# Patient Record
Sex: Female | Born: 1995 | Race: White | Hispanic: No | Marital: Single | Smoking: Never smoker
Health system: Southern US, Community
[De-identification: ages and names within clinical notes are randomized; demographics above are authoritative.]

## PROBLEM LIST (undated history)

## (undated) DIAGNOSIS — E109 Type 1 diabetes mellitus without complications: Secondary | ICD-10-CM

## (undated) DIAGNOSIS — E119 Type 2 diabetes mellitus without complications: Secondary | ICD-10-CM

## (undated) HISTORY — PX: TONSILLECTOMY: SUR1361

---
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
## 2019-09-03 ENCOUNTER — Ambulatory Visit
Admission: EM | Admit: 2019-09-03 | Discharge: 2019-09-03 | Disposition: A | Discharge status: Home / Self Care | Payer: 59 | Attending: Emergency Medicine | Admitting: Emergency Medicine

## 2019-09-03 ENCOUNTER — Encounter: Payer: Self-pay | Admitting: Emergency Medicine

## 2019-09-03 ENCOUNTER — Other Ambulatory Visit: Payer: Self-pay

## 2019-09-03 ENCOUNTER — Ambulatory Visit (INDEPENDENT_AMBULATORY_CARE_PROVIDER_SITE_OTHER): Payer: 59

## 2019-09-03 DIAGNOSIS — Z7189 Other specified counseling: Secondary | ICD-10-CM

## 2019-09-03 DIAGNOSIS — R05 Cough: Secondary | ICD-10-CM | POA: Diagnosis not present

## 2019-09-03 DIAGNOSIS — R079 Chest pain, unspecified: Secondary | ICD-10-CM

## 2019-09-03 DIAGNOSIS — R0981 Nasal congestion: Secondary | ICD-10-CM | POA: Diagnosis not present

## 2019-09-03 DIAGNOSIS — B9789 Other viral agents as the cause of diseases classified elsewhere: Secondary | ICD-10-CM | POA: Diagnosis not present

## 2019-09-03 DIAGNOSIS — R042 Hemoptysis: Secondary | ICD-10-CM

## 2019-09-03 DIAGNOSIS — J029 Acute pharyngitis, unspecified: Secondary | ICD-10-CM

## 2019-09-03 DIAGNOSIS — J069 Acute upper respiratory infection, unspecified: Secondary | ICD-10-CM

## 2019-09-03 HISTORY — DX: Type 2 diabetes mellitus without complications: E11.9

## 2019-09-03 HISTORY — DX: Type 1 diabetes mellitus without complications: E10.9

## 2019-09-03 LAB — RAPID STREP SCREEN (MED CTR MEBANE ONLY): Streptococcus, Group A Screen (Direct): NEGATIVE

## 2019-09-03 MED ORDER — BENZONATATE 100 MG PO CAPS: 100.0000 mg | ORAL_CAPSULE | Freq: Three times a day (TID) | ORAL | 0 refills | Status: DC | PRN

## 2019-09-03 NOTE — Discharge Instructions (Signed)
Take medication as prescribed.  Over-the-counter Mucinex and antihistamines.  Rest. Drink plenty of fluids.  Monitor.  Follow up with your primary care physician this week as needed. Return to Urgent care for new or worsening concerns.

## 2019-09-03 NOTE — ED Triage Notes (Signed)
Patient c/o nasal drainage and congestion that started 3 days ago. She started with a mild cough last night and the cough has increased this morning. She is concerned because she coughed up some blood tinged sputum this morning. Patient was tested for COVID 2 weeks ago so symptoms have developed since being tested.  She has been taking Mucinex D, Advil and has increased her water intake.

## 2019-09-03 NOTE — ED Provider Notes (Signed)
[facility] ____________________________________________  Time seen: Approximately 1:42 PM  I have reviewed the triage vital signs and the nursing notes.   HISTORY  Chief Complaint Nasal Congestion and Cough   HPI Natalie Marshall is a 23 y.o. female type I diabetic presenting for evaluation of 3 to 4 days of runny nose, nasal congestion and sore throat with onset of cough last night.  States cough has been somewhat productive of whitish-yellowish mucus.  States single episode of hemoptysis this morning described as pink blood mixed in with mucus after coughing.  Denies any other hemoptysis or abnormal bleeding.  Denies chest pain or shortness of breath.  Overall continues eat and drink well.  Denies known sick contacts.  No home sick contacts.  Denies fevers.  Reports blood sugars overall are doing well this morning was 170, she is on a continuous pump and monitor.  Sore throat currently mild.  Did take over-the-counter Mucinex which helped some.  Denies changes to taste or smell.  Denies vomiting, diarrhea, dysuria, abdominal pain.  Denies recent sickness.  But does report she was recently admitted for DKA approximate 3 weeks ago and had negative COVID testing at that time.  PCP: [facility]  No LMP recorded. (Menstrual status: IUD).  Denies pregnancy   Past Medical History:  Diagnosis Date  . Diabetes mellitus type 1, uncomplicated, on long term insulin pump ([place])   . Diabetes mellitus without complication ([place])     There are no active problems to display for this patient.   Past Surgical History:  Procedure Laterality Date  . TONSILLECTOMY       No current facility-administered medications for this encounter.   Current Outpatient Medications:  .  albuterol (VENTOLIN HFA) 108 (90 Base) MCG/ACT inhaler, Inhale into the lungs., Disp: , Rfl:  .  Continuous Blood Gluc Receiver (DEXCOM G6 RECEIVER) DEVI, , Disp: , Rfl:  .  insulin aspart (NOVOLOG FLEXPEN) 100 UNIT/ML  FlexPen, Inject into the skin., Disp: , Rfl:  .  Levonorgestrel (KYLEENA) 19.5 MG IUD, by Intrauterine route., Disp: , Rfl:  .  ondansetron (ZOFRAN-ODT) 8 MG disintegrating tablet, Take by mouth., Disp: , Rfl:  .  benzonatate (TESSALON PERLES) 100 MG capsule, Take 1 capsule (100 mg total) by mouth 3 (three) times daily as needed., Disp: 15 capsule, Rfl: 0  Allergies Patient has no known allergies.  History reviewed. No pertinent family history.  Social History Social History   Tobacco Use  . Smoking status: Never Smoker  . Smokeless tobacco: Never Used  Substance Use Topics  . Alcohol use: Not Currently  . Drug use: Yes    Types: Marijuana    Review of Systems Constitutional: No fever ENT: As above.  Positive nasal congestion. Cardiovascular: Denies chest pain. Respiratory: Denies shortness of breath. Gastrointestinal: No abdominal pain.  No nausea, no vomiting.  No diarrhea.  No constipation. Genitourinary: Negative for dysuria. Musculoskeletal: Negative for back pain. Skin: Negative for rash.   ____________________________________________   PHYSICAL EXAM:  VITAL SIGNS: ED Triage Vitals  Enc Vitals Group     BP 09/03/19 1319 (!) 136/96     Pulse Rate 09/03/19 1319 (!) 118 Recheck 94     Resp 09/03/19 1319 18     Temp 09/03/19 1319 98.4 F (36.9 C)     Temp Source 09/03/19 1319 Oral     SpO2 09/03/19 1319 98 %     Weight 09/03/19 1314 140 lb (63.5 kg)     Height 09/03/19 1314 5'  1" (1.549 m)     Head Circumference --      Peak Flow --      Pain Score 09/03/19 1312 3     Pain Loc --      Pain Edu? --      Excl. in [place]? --     Constitutional: Alert and oriented. Well appearing and in no acute distress. Eyes: Conjunctivae are normal.  Head: Atraumatic. No swelling. No erythema.  Ears: no erythema, normal TMs bilaterally.   Nose:Nasal congestion   Mouth/Throat: Mucous membranes are moist. Mild pharyngeal erythema. No tonsillar swelling or exudate.  Neck: No  stridor.  No cervical spine tenderness to palpation. Hematological/Lymphatic/Immunilogical: No cervical lymphadenopathy. Cardiovascular: Normal rate, regular rhythm. Grossly normal heart sounds.  Good peripheral circulation. Respiratory: Normal respiratory effort.  No retractions. No wheezes, rales or rhonchi. Good air movement.  Musculoskeletal: Ambulatory with steady gait.  Neurologic:  Normal speech and language. No gait instability. Skin:  Skin appears warm, dry and intact. No rash noted. Psychiatric: Mood and affect are normal. Speech and behavior are normal. ___________________________________________   LABS (all labs ordered are listed, but only abnormal results are displayed)  Labs Reviewed  RAPID STREP SCREEN ([facility] ONLY)  NOVEL CORONAVIRUS, NAA (HOSP ORDER, SEND-OUT TO REF LAB; TAT 18-24 HRS)  CULTURE, GROUP A STREP [facility])   ____________________________________________  RADIOLOGY  Dg Chest 2 View  Result Date: 09/03/2019 CLINICAL DATA:  Cough.  Hemoptysis. EXAM: CHEST - 2 VIEW COMPARISON:  None. FINDINGS: Curvature of the thoracic spine, apex to the right, is identified. The heart, hila, mediastinum, lungs, and pleura are otherwise normal. IMPRESSION: No active cardiopulmonary disease. Electronically Signed   By: Gerome Samavid  Williams III M.D   On: 09/03/2019 13:49   ____________________________________________   PROCEDURES Procedures    INITIAL IMPRESSION / ASSESSMENT AND PLAN / ED COURSE  Pertinent labs & imaging results that were available during my care of the patient were reviewed by me and considered in my medical decision making (see chart for details).  Overall well-appearing patient.  No acute distress.  Strep negative, will culture.  COVID-19 tested completed and advice given.  Suspect viral illness.  Chest x-ray negative.  Encourage continue over-the-counter Mucinex, Sudafed as needed and PRN Tessalon Perles.  Previously did take antihistamines regularly,  not currently, encouraged to restart.  Supportive care.  Discussed follow-up and return parameters.Discussed indication, risks and benefits of medications with patient.  Discussed follow up with Primary care physician this week as needed. Discussed follow up and return parameters including no resolution or any worsening concerns. Patient verbalized understanding and agreed to plan.   ____________________________________________   FINAL CLINICAL IMPRESSION(S) / ED DIAGNOSES  Final diagnoses:  Viral URI with cough  Advice Given About Covid-19 Virus Infection  Hemoptysis     ED Discharge Orders         Ordered    benzonatate (TESSALON PERLES) 100 MG capsule  3 times daily PRN     09/03/19 1351           Note: This dictation was prepared with Dragon dictation along with smaller phrase technology. Any transcriptional errors that result from this process are unintentional.         Renford DillsMiller, Lorra Freeman, NP 09/03/19 1415

## 2019-09-04 LAB — NOVEL CORONAVIRUS, NAA (HOSP ORDER, SEND-OUT TO REF LAB; TAT 18-24 HRS): SARS-CoV-2, NAA: NOT DETECTED

## 2019-09-06 LAB — CULTURE, GROUP A STREP (THRC)

## 2019-10-25 ENCOUNTER — Ambulatory Visit
Admission: EM | Admit: 2019-10-25 | Discharge: 2019-10-25 | Disposition: A | Discharge status: Home / Self Care | Payer: 59 | Attending: Family Medicine | Admitting: Family Medicine

## 2019-10-25 ENCOUNTER — Encounter: Payer: Self-pay | Admitting: Emergency Medicine

## 2019-10-25 ENCOUNTER — Other Ambulatory Visit: Payer: Self-pay

## 2019-10-25 DIAGNOSIS — J020 Streptococcal pharyngitis: Secondary | ICD-10-CM

## 2019-10-25 DIAGNOSIS — J029 Acute pharyngitis, unspecified: Secondary | ICD-10-CM | POA: Diagnosis not present

## 2019-10-25 LAB — RAPID STREP SCREEN (MED CTR MEBANE ONLY): Streptococcus, Group A Screen (Direct): POSITIVE — AB

## 2019-10-25 MED ORDER — LIDOCAINE VISCOUS HCL 2 % MT SOLN: OROMUCOSAL | 0 refills | Status: DC

## 2019-10-25 MED ORDER — PENICILLIN V POTASSIUM 500 MG PO TABS: 500.0000 mg | ORAL_TABLET | Freq: Three times a day (TID) | ORAL | 0 refills | Status: DC

## 2019-10-25 NOTE — ED Triage Notes (Signed)
Patient c/o sore throat that started 3 days ago.  Patient denies fevers.  Patient was seen on 9/17 for similar symptoms.  Patient was tested for COVID on 9/17 and was Negative.

## 2019-10-25 NOTE — Discharge Instructions (Signed)
Rest, fluids, tylenol/advil °

## 2019-10-25 NOTE — ED Provider Notes (Signed)
[facility]    CSN: [id number] Arrival date & time: 10/25/19  1035      History   Chief Complaint Chief Complaint  Patient presents with  . Sore Throat    HPI Natalie Marshall is a 23 y.o. female.   23 yo female with a c/o sore throat for the past 3 days. Denies any fevers, chills, shortness of breath, cough.    Sore Throat    Past Medical History:  Diagnosis Date  . Diabetes mellitus type 1, uncomplicated, on long term insulin pump ([place])   . Diabetes mellitus without complication ([place])     There are no active problems to display for this patient.   Past Surgical History:  Procedure Laterality Date  . TONSILLECTOMY      OB History   No obstetric history on file.      Home Medications    Prior to Admission medications   Medication Sig Start Date End Date Taking? Authorizing Provider  albuterol (VENTOLIN HFA) 108 (90 Base) MCG/ACT inhaler Inhale into the lungs. 10/29/17  Yes [provider]  Continuous Blood Gluc Receiver ([place]) [place]  04/09/19  Yes [provider]  insulin aspart (NOVOLOG FLEXPEN) 100 UNIT/ML FlexPen Inject into the skin. 07/03/19 07/02/20 Yes [provider]  Levonorgestrel (KYLEENA) 19.5 MG IUD by Intrauterine route. 02/22/17  Yes [provider]  benzonatate (TESSALON PERLES) 100 MG capsule Take 1 capsule (100 mg total) by mouth 3 (three) times daily as needed. 09/03/19   Marylene Land, NP  lidocaine (XYLOCAINE) 2 % solution 20 ml gargle and spit q 6 hours prn 10/25/19   Norval Gable, MD  ondansetron (ZOFRAN-ODT) 8 MG disintegrating tablet Take by mouth. 05/05/19   [provider]  penicillin v potassium (VEETID) 500 MG tablet Take 1 tablet (500 mg total) by mouth 3 (three) times daily. 10/25/19   Norval Gable, MD    Family History History reviewed. No pertinent family history.  Social History Social History   Tobacco Use  . Smoking status: Never Smoker  . Smokeless  tobacco: Never Used  Substance Use Topics  . Alcohol use: Not Currently  . Drug use: Yes    Types: Marijuana     Allergies   Patient has no known allergies.   Review of Systems Review of Systems   Physical Exam Triage Vital Signs ED Triage Vitals  Enc Vitals Group     BP 10/25/19 1048 (!) 141/89     Pulse Rate 10/25/19 1048 (!) 105     Resp 10/25/19 1048 16     Temp 10/25/19 1048 98.7 F (37.1 C)     Temp Source 10/25/19 1048 Oral     SpO2 10/25/19 1048 100 %     Weight 10/25/19 1044 138 lb (62.6 kg)     Height 10/25/19 1044 5\' 1"  (1.549 m)     Head Circumference --      Peak Flow --      Pain Score 10/25/19 1044 6     Pain Loc --      Pain Edu? --      Excl. in [place]? --    No data found.  Updated Vital Signs BP (!) 141/89 (BP Location: Left Arm)   Pulse (!) 105   Temp 98.7 F (37.1 C) (Oral)   Resp 16   Ht 5\' 1"  (1.549 m)   Wt 62.6 kg   LMP 10/16/2019 (Approximate)   SpO2 100%   BMI 26.07  kg/m   Visual Acuity Right Eye Distance:   Left Eye Distance:   Bilateral Distance:    Right Eye Near:   Left Eye Near:    Bilateral Near:     Physical Exam Vitals signs and nursing note reviewed.  Constitutional:      General: She is not in acute distress.    Appearance: She is not toxic-appearing or diaphoretic.  HENT:     Mouth/Throat:     Pharynx: Oropharyngeal exudate and posterior oropharyngeal erythema present.  Cardiovascular:     Rate and Rhythm: Tachycardia present.  Pulmonary:     Effort: Pulmonary effort is normal. No respiratory distress.  Neurological:     Mental Status: She is alert.      UC Treatments / Results  Labs (all labs ordered are listed, but only abnormal results are displayed) Labs Reviewed  RAPID STREP SCREEN ([facility] ONLY) - Abnormal; Notable for the following components:      Result Value   Streptococcus, Group A Screen (Direct) POSITIVE (*)    All other components within normal limits  NOVEL CORONAVIRUS, NAA  (HOSPITAL ORDER, SEND-OUT TO REF LAB)    EKG   Radiology No results found.  Procedures Procedures (including critical care time)  Medications Ordered in UC Medications - No data to display  Initial Impression / Assessment and Plan / UC Course  I have reviewed the triage vital signs and the nursing notes.  Pertinent labs & imaging results that were available during my care of the patient were reviewed by me and considered in my medical decision making (see chart for details).      Final Clinical Impressions(s) / UC Diagnoses   Final diagnoses:  Strep throat     Discharge Instructions     Rest, fluids, tylenol/advil    ED Prescriptions    Medication Sig Dispense Auth. Provider   penicillin v potassium (VEETID) 500 MG tablet Take 1 tablet (500 mg total) by mouth 3 (three) times daily. 30 tablet Odile Veloso, Pamala Hurry, MD   lidocaine (XYLOCAINE) 2 % solution 20 ml gargle and spit q 6 hours prn 100 mL Payton Mccallum, MD      1. Lab results and diagnosis reviewed with patient 2. rx as per orders above; reviewed possible side effects, interactions, risks and benefits  3. Recommend supportive treatment as above 4. covid test done 5. Follow-up prn if symptoms worsen or don't improve  PDMP not reviewed this encounter.   Payton Mccallum, MD 10/25/19 [phone]

## 2019-10-26 LAB — NOVEL CORONAVIRUS, NAA (HOSP ORDER, SEND-OUT TO REF LAB; TAT 18-24 HRS): SARS-CoV-2, NAA: NOT DETECTED

## 2019-12-25 ENCOUNTER — Ambulatory Visit
Admission: EM | Admit: 2019-12-25 | Discharge: 2019-12-25 | Disposition: A | Discharge status: Home / Self Care | Payer: Medicaid Other | Attending: Family Medicine | Admitting: Family Medicine

## 2019-12-25 ENCOUNTER — Encounter: Payer: Self-pay | Admitting: Emergency Medicine

## 2019-12-25 ENCOUNTER — Other Ambulatory Visit: Payer: Self-pay

## 2019-12-25 DIAGNOSIS — R112 Nausea with vomiting, unspecified: Secondary | ICD-10-CM | POA: Diagnosis not present

## 2019-12-25 DIAGNOSIS — Z20822 Contact with and (suspected) exposure to covid-19: Secondary | ICD-10-CM

## 2019-12-25 NOTE — ED Triage Notes (Addendum)
Patient here to be tested for COVID.  Patient states that she was diagnosed and treated for vomiting and diarrhea on 12/22/18 at [facility] ED.   Patient states that she is not having any symptoms at this time.

## 2019-12-25 NOTE — ED Provider Notes (Signed)
[facility] ____________________________________________  Time seen: Approximately 2:25 PM  I have reviewed the triage vital signs and the nursing notes.   HISTORY  Chief Complaint COVID Test   HPI Natalie Marshall is a 24 y.o. female present for testing COVID-19.  Patient type I diabetic who on 12/23/2019 was seen at [facility] due to having 5 hours of continued nausea and vomiting.  States she had some diarrhea but not a lot.  Reports while she was there she was found to be mildly in the DKA and after IV fluids and IV insulin anion gap closed and she was feeling much better and was discharged.  Patient states that she has since been feeling well.  States her blood sugar has regulated and her blood sugar this morning was 76.  Has continued eating and drinking well.  No other nausea or vomiting.  No diarrhea.  Denies any cough, chest pain or shortness of breath, abdominal pain, sore throat, congestion, change in taste or smell, fevers.  Reports feels well.  States wanted to have COVID-19 testing as her boyfriend's mom requested her to have it prior to rate being around her boyfriend again.  Richrd Humbles, MD : PCP    Past Medical History:  Diagnosis Date  . Diabetes mellitus type 1, uncomplicated, on long term insulin pump ([place])   . Diabetes mellitus without complication ([place])     There are no problems to display for this patient.   Past Surgical History:  Procedure Laterality Date  . TONSILLECTOMY       No current facility-administered medications for this encounter.  Current Outpatient Medications:  .  insulin aspart (NOVOLOG FLEXPEN) 100 UNIT/ML FlexPen, Inject into the skin., Disp: , Rfl:  .  Levonorgestrel (KYLEENA) 19.5 MG IUD, by Intrauterine route., Disp: , Rfl:  .  albuterol (VENTOLIN HFA) 108 (90 Base) MCG/ACT inhaler, Inhale into the lungs., Disp: , Rfl:  .  Continuous Blood Gluc Receiver (DEXCOM G6 RECEIVER) DEVI, , Disp: , Rfl:  .  lidocaine  (XYLOCAINE) 2 % solution, 20 ml gargle and spit q 6 hours prn, Disp: 100 mL, Rfl: 0 .  ondansetron (ZOFRAN-ODT) 8 MG disintegrating tablet, Take by mouth., Disp: , Rfl:   Allergies Patient has no known allergies.  Family History  Problem Relation Age of Onset  . Cancer Mother   . Healthy Father     Social History Social History   Tobacco Use  . Smoking status: Never Smoker  . Smokeless tobacco: Never Used  Substance Use Topics  . Alcohol use: Not Currently  . Drug use: Yes    Types: Marijuana    Review of Systems Constitutional: No fever ENT: No sore throat. Cardiovascular: Denies chest pain. Respiratory: Denies shortness of breath. Gastrointestinal: No abdominal pain. As above.  Genitourinary: Negative for dysuria. Musculoskeletal: Negative for back pain. Skin: Negative for rash.   ____________________________________________   PHYSICAL EXAM:  VITAL SIGNS: ED Triage Vitals  Enc Vitals Group     BP 12/25/19 1323 (!) 152/82     Pulse Rate 12/25/19 1323 73     Resp 12/25/19 1323 14     Temp 12/25/19 1323 98.5 F (36.9 C)     Temp Source 12/25/19 1323 Oral     SpO2 12/25/19 1323 100 %     Weight 12/25/19 1320 147 lb (66.7 kg)     Height 12/25/19 1320 5\' 1"  (1.549 m)     Head Circumference --      Peak Flow --  Pain Score 12/25/19 1320 0     Pain Loc --      Pain Edu? --      Excl. in [place]? --     Constitutional: Alert and oriented. Well appearing and in no acute distress. Eyes: Conjunctivae are normal.  ENT      Head: Normocephalic       Nose: No congestion      Mouth/Throat: Mucous membranes are moist.Oropharynx non-erythematous. Cardiovascular: Normal rate, regular rhythm. Grossly normal heart sounds.  Good peripheral circulation. Respiratory: Normal respiratory effort without tachypnea nor retractions. Breath sounds are clear and equal bilaterally. No wheezes, rales, rhonchi. Gastrointestinal: Soft and nontender. Musculoskeletal: Steady gait.    Neurologic:  Normal speech and language. Speech is normal. No gait instability.  Skin:  Skin is warm, dry and intact. No rash noted. Psychiatric: Mood and affect are normal. Speech and behavior are normal. Patient exhibits appropriate insight and judgment   ___________________________________________   LABS (all labs ordered are listed, but only abnormal results are displayed)  Labs Reviewed  NOVEL CORONAVIRUS, NAA (HOSP ORDER, SEND-OUT TO REF LAB; TAT 18-24 HRS)   ____________________________________________   PROCEDURES Procedures    INITIAL IMPRESSION / ASSESSMENT AND PLAN / ED COURSE  Pertinent labs & imaging results that were available during my care of the patient were reviewed by me and considered in my medical decision making (see chart for details).  Well-appearing patient.  No acute distress.  Recent nausea and vomiting as above.  Patient feeling better and symptoms resolved.  COVID-19 testing completed and advice given.  Continue to monitor and supportive care.  Discussed follow up and return parameters including no resolution or any worsening concerns. Patient verbalized understanding and agreed to plan.   ____________________________________________   FINAL CLINICAL IMPRESSION(S) / ED DIAGNOSES  Final diagnoses:  Encounter for screening laboratory testing for COVID-19 virus     ED Discharge Orders    None       Note: This dictation was prepared with Dragon dictation along with smaller phrase technology. Any transcriptional errors that result from this process are unintentional.         Renford Dills, NP 12/25/19 1428

## 2019-12-26 LAB — NOVEL CORONAVIRUS, NAA (HOSP ORDER, SEND-OUT TO REF LAB; TAT 18-24 HRS): SARS-CoV-2, NAA: NOT DETECTED

## 2020-01-04 ENCOUNTER — Ambulatory Visit
Admission: EM | Admit: 2020-01-04 | Discharge: 2020-01-04 | Disposition: A | Discharge status: Home / Self Care | Payer: Medicaid Other | Attending: Emergency Medicine | Admitting: Emergency Medicine

## 2020-01-04 ENCOUNTER — Encounter: Payer: Self-pay | Admitting: Emergency Medicine

## 2020-01-04 ENCOUNTER — Ambulatory Visit: Payer: Medicaid Other

## 2020-01-04 ENCOUNTER — Other Ambulatory Visit: Payer: Self-pay

## 2020-01-04 DIAGNOSIS — M79672 Pain in left foot: Secondary | ICD-10-CM | POA: Insufficient documentation

## 2020-01-04 DIAGNOSIS — M722 Plantar fascial fibromatosis: Secondary | ICD-10-CM | POA: Diagnosis present

## 2020-01-04 MED ORDER — MELOXICAM 15 MG PO TABS: 15.0000 mg | ORAL_TABLET | Freq: Every day | ORAL | 0 refills | Status: DC | PRN

## 2020-01-04 NOTE — ED Provider Notes (Signed)
[facility] ____________________________________________  Time seen: Approximately 3:52 PM  I have reviewed the triage vital signs and the nursing notes.   HISTORY  Chief Complaint Foot Pain (left)   HPI Natalie Marshall is a 24 y.o. female presenting for evaluation of left foot pain present for the last 1 week.  Patient reports no specific injury, however states that she does often step on things and have small minor injuries, but does not recall any provoking injury.  Has continued remain ambulatory but today hurts more to walk.  States pain is predominantly the bottom of her heel and along the arch.  Denies pain radiation, paresthesias or loss of range of motion.  Reports leg otherwise feels fine.  Did take some ibuprofen which helps some.  Denies other aggravating alleviating factors.  Denies cough or fever.  No LMP recorded. (Menstrual status: IUD).  Durenda Hurt, MD : PCP   Past Medical History:  Diagnosis Date  . Diabetes mellitus type 1, uncomplicated, on long term insulin pump (HCC)   . Diabetes mellitus without complication (HCC)     There are no problems to display for this patient.   Past Surgical History:  Procedure Laterality Date  . TONSILLECTOMY       No current facility-administered medications for this encounter.  Current Outpatient Medications:  .  albuterol (VENTOLIN HFA) 108 (90 Base) MCG/ACT inhaler, Inhale into the lungs., Disp: , Rfl:  .  Continuous Blood Gluc Receiver (DEXCOM G6 RECEIVER) DEVI, , Disp: , Rfl:  .  insulin aspart (NOVOLOG FLEXPEN) 100 UNIT/ML FlexPen, Inject into the skin., Disp: , Rfl:  .  Levonorgestrel (KYLEENA) 19.5 MG IUD, by Intrauterine route., Disp: , Rfl:  .  ondansetron (ZOFRAN-ODT) 8 MG disintegrating tablet, Take by mouth., Disp: , Rfl:  .  meloxicam (MOBIC) 15 MG tablet, Take 1 tablet (15 mg total) by mouth daily as needed., Disp: 10 tablet, Rfl: 0  Allergies Patient has no known allergies.  Family  History  Problem Relation Age of Onset  . Cancer Mother   . Healthy Father     Social History Social History   Tobacco Use  . Smoking status: Never Smoker  . Smokeless tobacco: Never Used  Substance Use Topics  . Alcohol use: Not Currently  . Drug use: Yes    Types: Marijuana    Review of Systems Constitutional: No fever. ENT: No sore throat. Cardiovascular: Denies chest pain. Respiratory: Denies shortness of breath. Gastrointestinal: No abdominal pain.  Musculoskeletal: Positive left foot pain. Skin: Negative for rash.   ____________________________________________   PHYSICAL EXAM:  VITAL SIGNS: ED Triage Vitals  Enc Vitals Group     BP 01/04/20 1438 127/85     Pulse Rate 01/04/20 1438 75     Resp 01/04/20 1438 18     Temp 01/04/20 1438 98.2 F (36.8 C)     Temp Source 01/04/20 1438 Oral     SpO2 01/04/20 1438 100 %     Weight 01/04/20 1435 150 lb (68 kg)     Height 01/04/20 1435 5' 1.5" (1.562 m)     Head Circumference --      Peak Flow --      Pain Score 01/04/20 1435 5     Pain Loc --      Pain Edu? --      Excl. in GC? --     Constitutional: Alert and oriented. Well appearing and in no acute distress. Eyes: Conjunctivae are normal.  ENT  Head: Normocephalic and atraumatic. Cardiovascular:Good peripheral circulation. Respiratory: Normal respiratory effort without tachypnea nor retractions.  Musculoskeletal: Steady gait.  Distal dorsalis pedis and posterior tibialis pulses equal.  Left foot normal distal sensation and capillary refill. Except: Left foot plantar heel Distally along medial arch tenderness to direct palpation with minimal localized swelling, no true ecchymosis, no erythema, able to plantarflex and dorsiflex with mild pain, left lower extremity otherwise nontender. Neurologic:  Normal speech and language.  Skin:  Skin is warm, dry and intact. No rash noted. Psychiatric: Mood and affect are normal. Speech and behavior are normal.  Patient exhibits appropriate insight and judgment   ___________________________________________   LABS (all labs ordered are listed, but only abnormal results are displayed)  Labs Reviewed - No data to display ____________________________________________  RADIOLOGY  DG Foot Complete Left  Result Date: 01/04/2020 CLINICAL DATA:  Left foot pain.  No injury. EXAM: LEFT FOOT - COMPLETE 3+ VIEW COMPARISON:  None. FINDINGS: There is no evidence of fracture or dislocation. There is no evidence of arthropathy or other focal bone abnormality. Soft tissues are unremarkable. IMPRESSION: Negative. Electronically Signed   By: Abelardo Diesel M.D.   On: 01/04/2020 15:14   ____________________________________________   PROCEDURES Procedures     INITIAL IMPRESSION / ASSESSMENT AND PLAN / ED COURSE  Pertinent labs & imaging results that were available during my care of the patient were reviewed by me and considered in my medical decision making (see chart for details).  Well-appearing patient.  No acute distress.  Left foot pain.  Left foot x-ray as above negative.  Suspect tendinitis versus plantar fasciitis.we will treat with oral Mobic and postop shoe.  Discussed frozen water bottle ice, stretches and sleeve brace.  Follow-up podiatry for continued pain.  Work note given.  Discussed indication, risks and benefits of medications with patient. Discussed follow up and return parameters including no resolution or any worsening concerns. Patient verbalized understanding and agreed to plan.   ____________________________________________   FINAL CLINICAL IMPRESSION(S) / ED DIAGNOSES  Final diagnoses:  Left foot pain  Plantar fasciitis     ED Discharge Orders         Ordered    meloxicam (MOBIC) 15 MG tablet  Daily PRN     01/04/20 1520           Note: This dictation was prepared with Dragon dictation along with smaller phrase technology. Any transcriptional errors that result from this  process are unintentional.         Marylene Land, NP 01/04/20 1604

## 2020-01-04 NOTE — Discharge Instructions (Addendum)
Take medication as prescribed. Rest. Drink plenty of fluids. Wrap. Use post-op shoe this week. Stretch. Ice.   Follow podiatry as needed for continued pain.   Follow up with your primary care physician this week as needed. Return to Urgent care for new or worsening concerns.

## 2020-01-04 NOTE — ED Triage Notes (Signed)
Pt c/o left foot pain. Pain is located in the arch. She states that she does not remember if she injured it but it is bruised int he area. The pain started about a week ago and has gotten worse. She states she can not move her great toe as much on the left foot as she can on the right foot.

## 2020-05-24 IMAGING — CR DG CHEST 2V
2 series · 2 of 2 positions shown · non-contrast
Comparison: None.

CLINICAL DATA: Cough.  Hemoptysis.

EXAM:
CHEST - 2 VIEW

[chest pa]
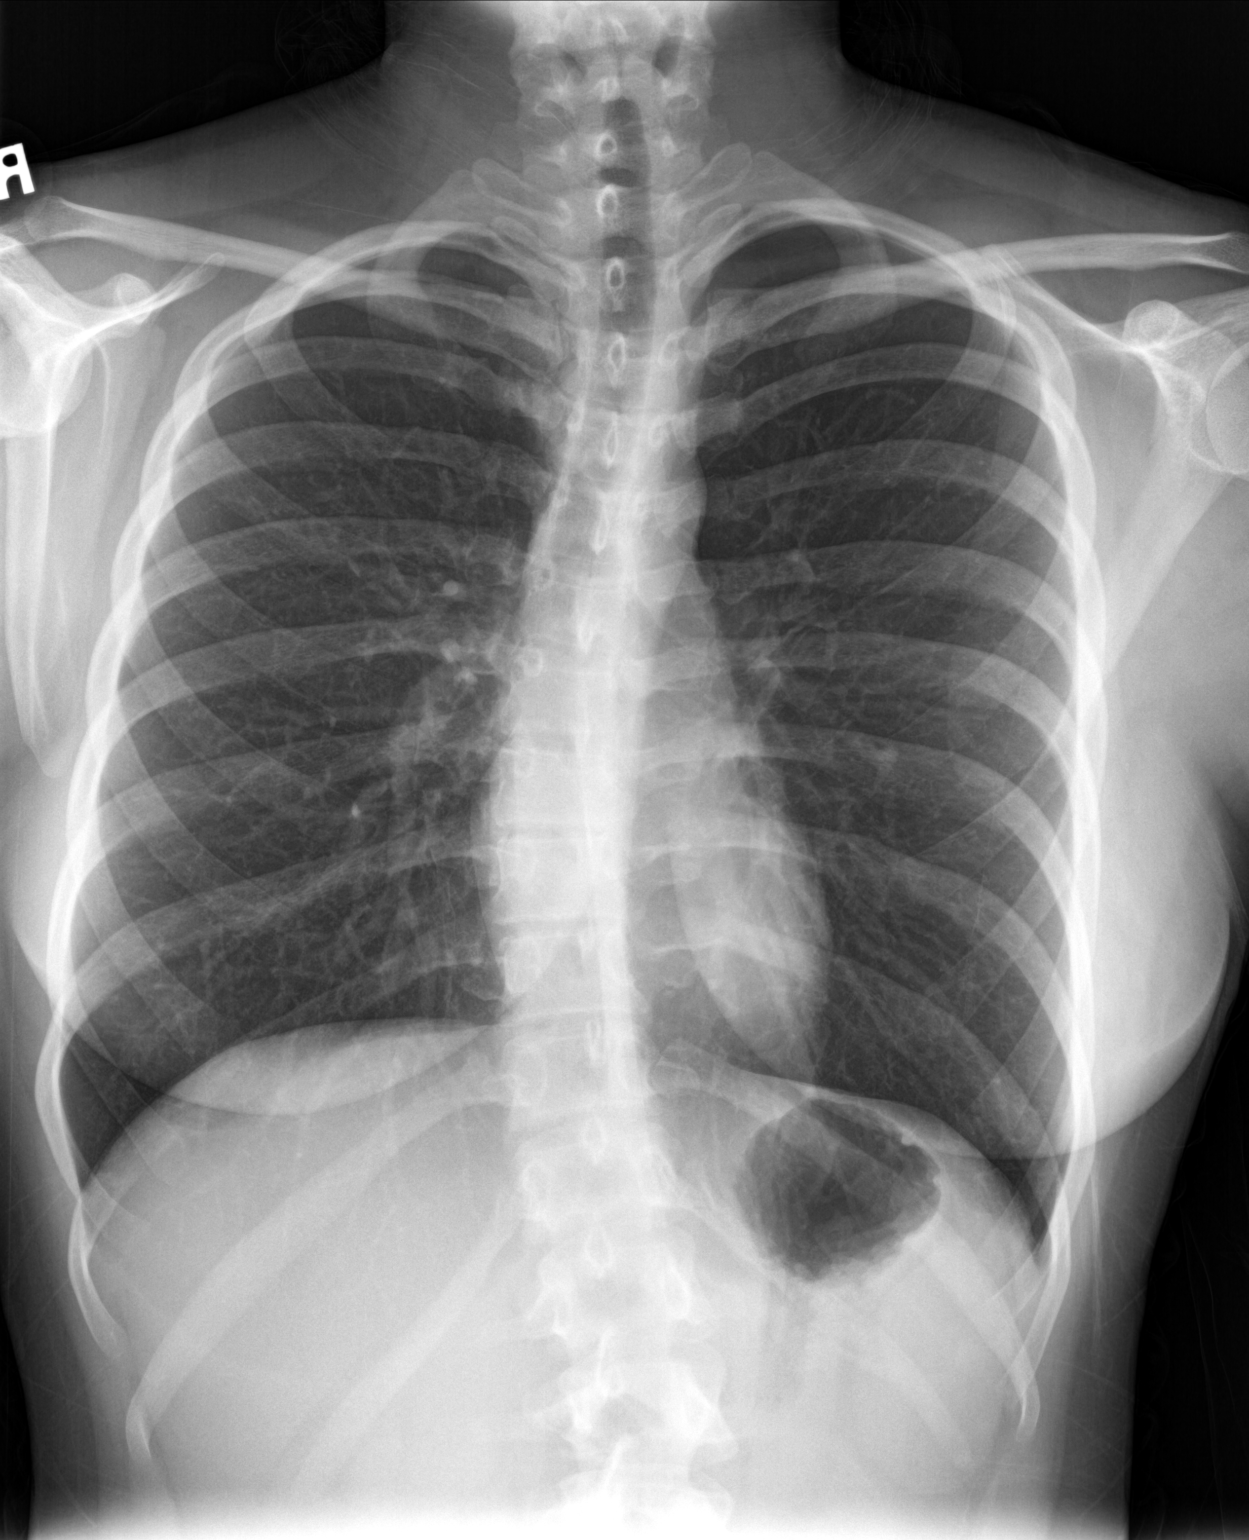

[chest lat]
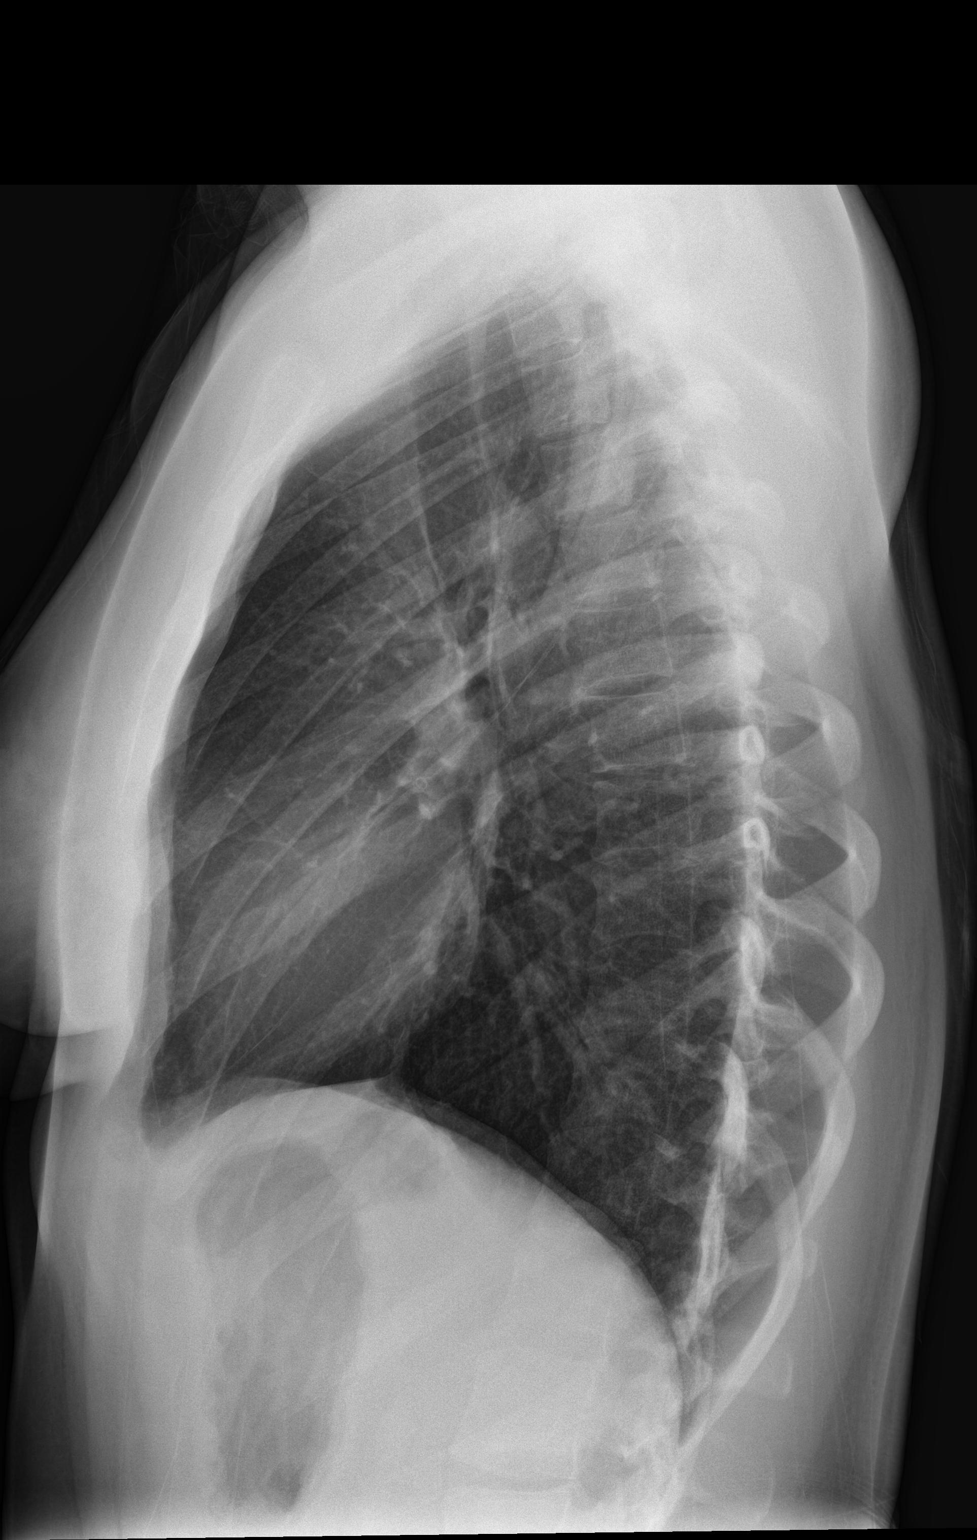

[2 of 2 positions shown; findings below may reference images not displayed]

FINDINGS: Curvature of the thoracic spine, apex to the right, is identified.
The heart, hila, mediastinum, lungs, and pleura are otherwise
normal.
IMPRESSION: No active cardiopulmonary disease.

## 2020-06-24 ENCOUNTER — Other Ambulatory Visit: Payer: Self-pay

## 2020-06-24 ENCOUNTER — Ambulatory Visit
Admission: EM | Admit: 2020-06-24 | Discharge: 2020-06-24 | Disposition: A | Discharge status: Home / Self Care | Payer: Medicaid Other | Attending: Family Medicine | Admitting: Family Medicine

## 2020-06-24 DIAGNOSIS — Z794 Long term (current) use of insulin: Secondary | ICD-10-CM | POA: Insufficient documentation

## 2020-06-24 DIAGNOSIS — J0140 Acute pansinusitis, unspecified: Secondary | ICD-10-CM | POA: Diagnosis not present

## 2020-06-24 DIAGNOSIS — Z79899 Other long term (current) drug therapy: Secondary | ICD-10-CM | POA: Diagnosis not present

## 2020-06-24 DIAGNOSIS — Z9641 Presence of insulin pump (external) (internal): Secondary | ICD-10-CM | POA: Diagnosis not present

## 2020-06-24 DIAGNOSIS — E109 Type 1 diabetes mellitus without complications: Secondary | ICD-10-CM | POA: Insufficient documentation

## 2020-06-24 DIAGNOSIS — Z20822 Contact with and (suspected) exposure to covid-19: Secondary | ICD-10-CM | POA: Diagnosis not present

## 2020-06-24 DIAGNOSIS — J3489 Other specified disorders of nose and nasal sinuses: Secondary | ICD-10-CM

## 2020-06-24 DIAGNOSIS — Z793 Long term (current) use of hormonal contraceptives: Secondary | ICD-10-CM | POA: Insufficient documentation

## 2020-06-24 DIAGNOSIS — R590 Localized enlarged lymph nodes: Secondary | ICD-10-CM | POA: Diagnosis not present

## 2020-06-24 MED ORDER — AMOXICILLIN-POT CLAVULANATE 875-125 MG PO TABS: 1.0000 | ORAL_TABLET | Freq: Two times a day (BID) | ORAL | 0 refills | Status: AC

## 2020-06-24 NOTE — Discharge Instructions (Addendum)
Recommend start Augmentin 875mg  twice a day as directed. Continue to increase fluids to help loosen up mucus in sinuses. Continue to monitor blood sugars and ketone levels- if remain elevated, contact your Endocrinologist. Follow-up in 3 to 4 days if not improving and pending COVID 19 test results.

## 2020-06-24 NOTE — ED Provider Notes (Signed)
[facility]    CSN: [id number] Arrival date & time: 06/24/20  1159      History   Chief Complaint Chief Complaint  Patient presents with  . Sinus Problem    HPI Natalie Marshall is a 24 y.o. female.   24 year old female presents with sinus pressure, sinus drainage, headache, sore throat and fatigue that started 4 days ago. Getting worse with swollen lymph node in back of neck and some diarrhea. Denies any distinct fever, nausea or vomiting. Has taken Ibuprofen with minimal relief. No other family members ill. Did not get vaccinated against COVID 19. Other chronic health issues include type 1 DM- on insulin pump. Sugar levels were elevated at start on illness (588) and continue to range from 189 to 349 with positive ketones in her urine. Usually she can increase fluids and help decrease her ketones and levels are slightly lower today. Currently on Novolog insulin, Kyleena IUD, and Celebrex, Zofran and Albuterol prn.   The history is provided by the patient.    Past Medical History:  Diagnosis Date  . Diabetes mellitus type 1, uncomplicated, on long term insulin pump (HCC)   . Diabetes mellitus without complication (HCC)     There are no problems to display for this patient.   Past Surgical History:  Procedure Laterality Date  . TONSILLECTOMY      OB History   No obstetric history on file.      Home Medications    Prior to Admission medications   Medication Sig Start Date End Date Taking? Authorizing Provider  albuterol (VENTOLIN HFA) 108 (90 Base) MCG/ACT inhaler Inhale into the lungs. 10/29/17   [provider]  amoxicillin-clavulanate (AUGMENTIN) 875-125 MG tablet Take 1 tablet by mouth every 12 (twelve) hours for 7 days. 06/24/20 07/01/20  Sudie Grumbling, NP  Continuous Blood Gluc Receiver (DEXCOM G6 RECEIVER) DEVI  04/09/19   [provider]  insulin aspart (NOVOLOG FLEXPEN) 100 UNIT/ML FlexPen Inject into the skin. 07/03/19 07/02/20   [provider]  Levonorgestrel (KYLEENA) 19.5 MG IUD by Intrauterine route. 02/22/17   [provider]  ondansetron (ZOFRAN-ODT) 8 MG disintegrating tablet Take by mouth. 05/05/19   [provider]    Family History Family History  Problem Relation Age of Onset  . Cancer Mother   . Healthy Father     Social History Social History   Tobacco Use  . Smoking status: Never Smoker  . Smokeless tobacco: Never Used  Vaping Use  . Vaping Use: Some days  Substance Use Topics  . Alcohol use: Not Currently  . Drug use: Yes    Types: Marijuana     Allergies   Marine algaes [algae extract] and Pecan extract allergy skin test   Review of Systems Review of Systems  Constitutional: Positive for fatigue. Negative for activity change, appetite change, chills and fever.  HENT: Positive for congestion, ear pain (ears plugged), postnasal drip, sinus pressure, sinus pain and sore throat. Negative for ear discharge, facial swelling, mouth sores, nosebleeds, rhinorrhea, sneezing and trouble swallowing.   Eyes: Negative for pain, discharge, redness and itching.  Respiratory: Positive for cough. Negative for chest tightness, shortness of breath and wheezing.   Gastrointestinal: Positive for diarrhea. Negative for nausea and vomiting.  Musculoskeletal: Negative for arthralgias, myalgias, neck pain and neck stiffness.  Skin: Negative for color change, rash and wound.  Allergic/Immunologic: Positive for food allergies. Negative for environmental allergies.  Neurological: Positive for headaches. Negative for dizziness,  tremors, seizures, syncope, weakness, light-headedness and numbness.  Hematological: Positive for adenopathy.     Physical Exam Triage Vital Signs ED Triage Vitals  Enc Vitals Group     BP 06/24/20 1239 123/88     Pulse Rate 06/24/20 1239 89     Resp 06/24/20 1239 18     Temp 06/24/20 1239 99.2 F (37.3 C)     Temp Source 06/24/20 1239 Oral     SpO2  06/24/20 1239 99 %     Weight 06/24/20 1246 144 lb (65.3 kg)     Height --      Head Circumference --      Peak Flow --      Pain Score 06/24/20 1234 5     Pain Loc --      Pain Edu? --      Excl. in GC? --    No data found.  Updated Vital Signs BP 123/88 (BP Location: Right Arm)   Pulse 89   Temp 99.2 F (37.3 C) (Oral)   Resp 18   Wt 144 lb (65.3 kg)   SpO2 99%   BMI 26.77 kg/m   Visual Acuity Right Eye Distance:   Left Eye Distance:   Bilateral Distance:    Right Eye Near:   Left Eye Near:    Bilateral Near:     Physical Exam Vitals and nursing note reviewed.  Constitutional:      General: She is awake. She is not in acute distress.    Appearance: She is well-developed and well-groomed.     Comments: She is sitting comfortably on the exam table in no acute distress but appears ill.   HENT:     Head: Normocephalic and atraumatic.     Right Ear: Hearing, ear canal and external ear normal. Tympanic membrane is bulging. Tympanic membrane is not injected or erythematous.     Left Ear: Hearing, ear canal and external ear normal. Tympanic membrane is bulging. Tympanic membrane is not injected or erythematous.     Nose: Congestion present.     Right Sinus: Maxillary sinus tenderness and frontal sinus tenderness present.     Left Sinus: Maxillary sinus tenderness and frontal sinus tenderness present.     Mouth/Throat:     Lips: Pink.     Mouth: Mucous membranes are moist.     Pharynx: Uvula midline. Posterior oropharyngeal erythema present. No pharyngeal swelling, oropharyngeal exudate or uvula swelling.  Cardiovascular:     Rate and Rhythm: Normal rate and regular rhythm.     Heart sounds: Normal heart sounds. No murmur heard.   Pulmonary:     Effort: Pulmonary effort is normal. No respiratory distress.     Breath sounds: Normal breath sounds and air entry. No decreased air movement. No decreased breath sounds, wheezing, rhonchi or rales.  Musculoskeletal:         General: Normal range of motion.  Lymphadenopathy:     Head:     Right side of head: No tonsillar adenopathy.     Left side of head: No tonsillar adenopathy.     Cervical: Cervical adenopathy present.     Right cervical: Posterior cervical adenopathy present. No superficial cervical adenopathy.    Left cervical: No superficial or posterior cervical adenopathy.  Skin:    General: Skin is warm and dry.     Capillary Refill: Capillary refill takes less than 2 seconds.     Findings: No rash.  Neurological:     General:  No focal deficit present.     Mental Status: She is alert and oriented to person, place, and time.  Psychiatric:        Mood and Affect: Mood normal.        Behavior: Behavior normal. Behavior is cooperative.        Thought Content: Thought content normal.        Judgment: Judgment normal.      UC Treatments / Results  Labs (all labs ordered are listed, but only abnormal results are displayed) Labs Reviewed  SARS CORONAVIRUS 2 (TAT 6-24 HRS)    EKG   Radiology No results found.  Procedures Procedures (including critical care time)  Medications Ordered in UC Medications - No data to display  Initial Impression / Assessment and Plan / UC Course  I have reviewed the triage vital signs and the nursing notes.  Pertinent labs & imaging results that were available during my care of the patient were reviewed by me and considered in my medical decision making (see chart for details).    Reviewed with patient that she appears to have an early sinus infection- may be viral but due to type 1 DM with elevated sugar levels and ketones, will treat for possible bacterial infection. Start Augmentin 875mg  twice a day as directed. Continue to increase fluids to help loosen up mucus in sinuses. May continue Albuterol inhaler as needed for cough or wheezing. Continue Ibuprofen 600mg  every 8 hours as needed for headache and sinus pain. Continue to monitor glucose and ketone  levels- if remain elevated, contact her Endocrinologist ASAP. Follow-up pending COVID 19 test results and in 3 to 4 days if not improving.  Final Clinical Impressions(s) / UC Diagnoses   Final diagnoses:  Acute non-recurrent pansinusitis  Sinus pressure  Lymphadenopathy, posterior cervical     Discharge Instructions     Recommend start Augmentin 875mg  twice a day as directed. Continue to increase fluids to help loosen up mucus in sinuses. Continue to monitor blood sugars and ketone levels- if remain elevated, contact your Endocrinologist. Follow-up in 3 to 4 days if not improving and pending COVID 19 test results.     ED Prescriptions    Medication Sig Dispense Auth. Provider   amoxicillin-clavulanate (AUGMENTIN) 875-125 MG tablet Take 1 tablet by mouth every 12 (twelve) hours for 7 days. 14 tablet Aleisa Howk, , NP     PDMP not reviewed this encounter.   , NP 06/25/20 [phone]

## 2020-06-24 NOTE — ED Triage Notes (Signed)
Pt is here with sinus pressure, nasal drainage, swollen lymph node that started 3 days ago, pt states she is a diabetic and her glucose this morning it was 349 then 189. Pt has not taken anything to relieve discomfort.

## 2020-06-25 LAB — SARS CORONAVIRUS 2 (TAT 6-24 HRS): SARS Coronavirus 2: NEGATIVE

## 2020-09-24 IMAGING — CR DG FOOT COMPLETE 3+V*L*
3 series · 3 of 3 positions shown · non-contrast
Comparison: None.

CLINICAL DATA: Left foot pain.  No injury.

EXAM:
LEFT FOOT - COMPLETE 3+ VIEW

[foot ap]
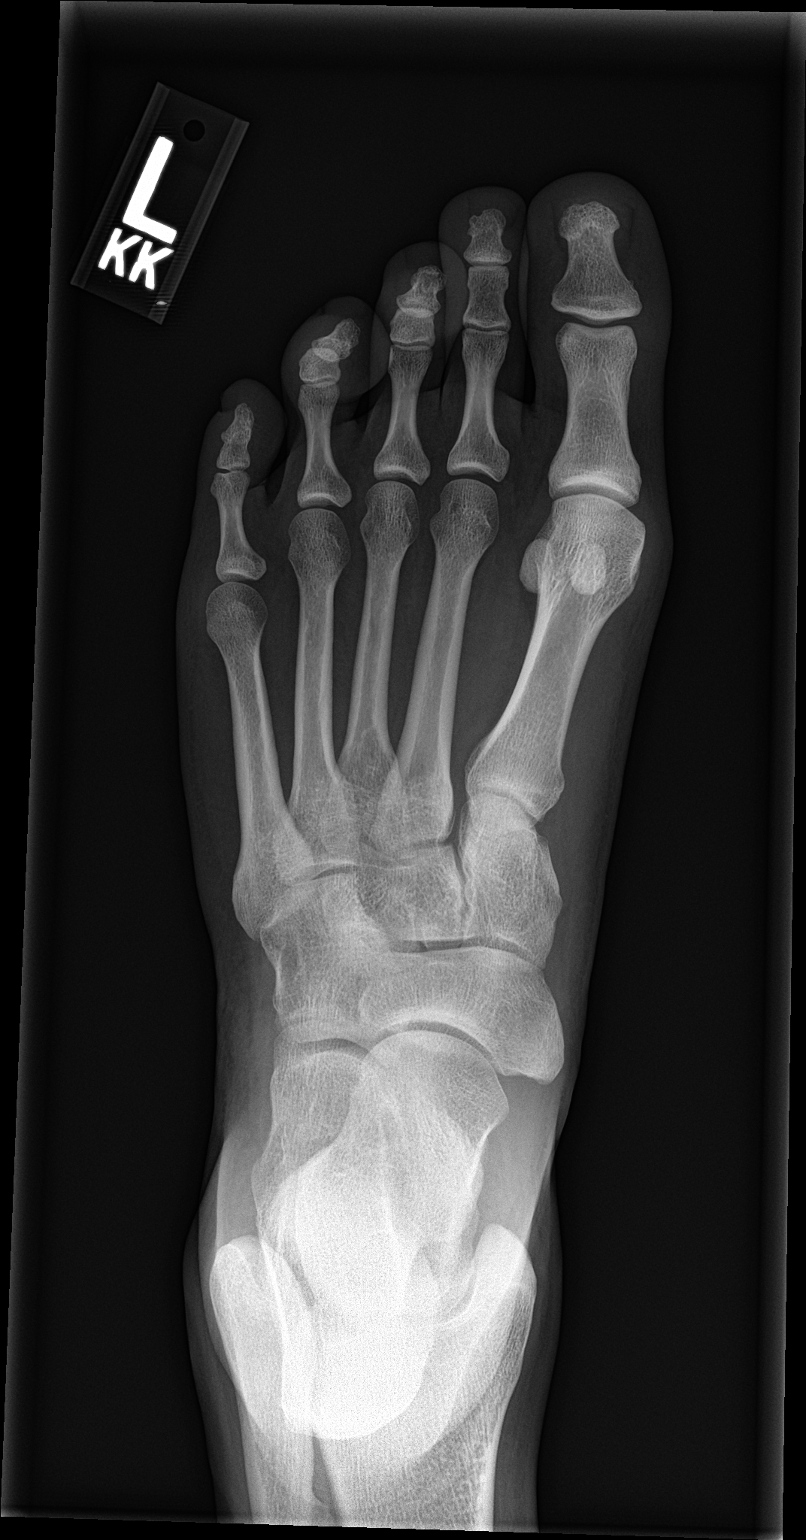

[foot obl]
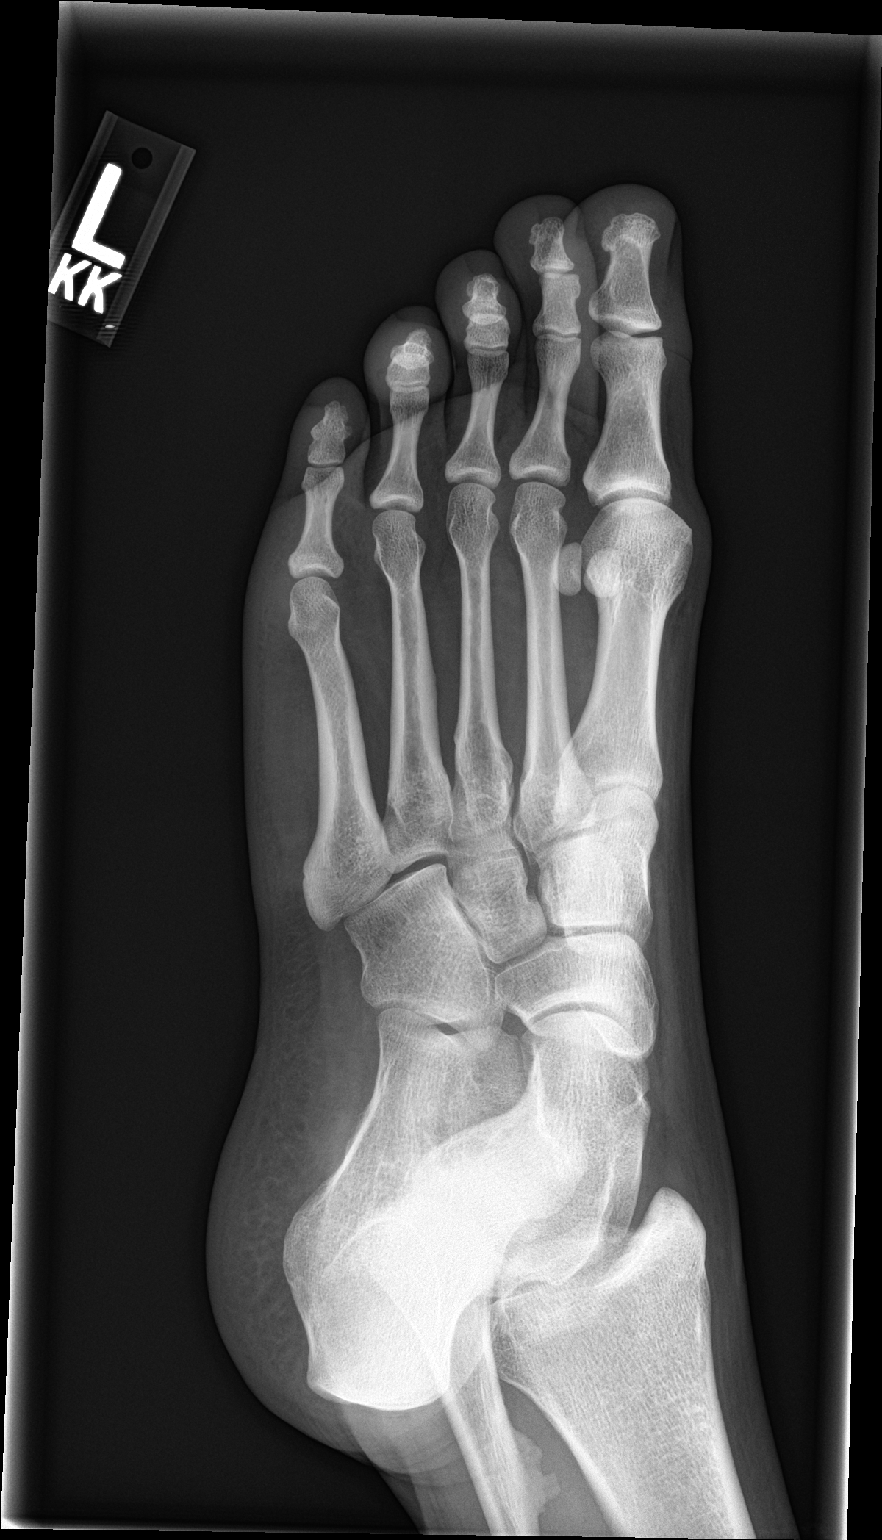

[foot lat]
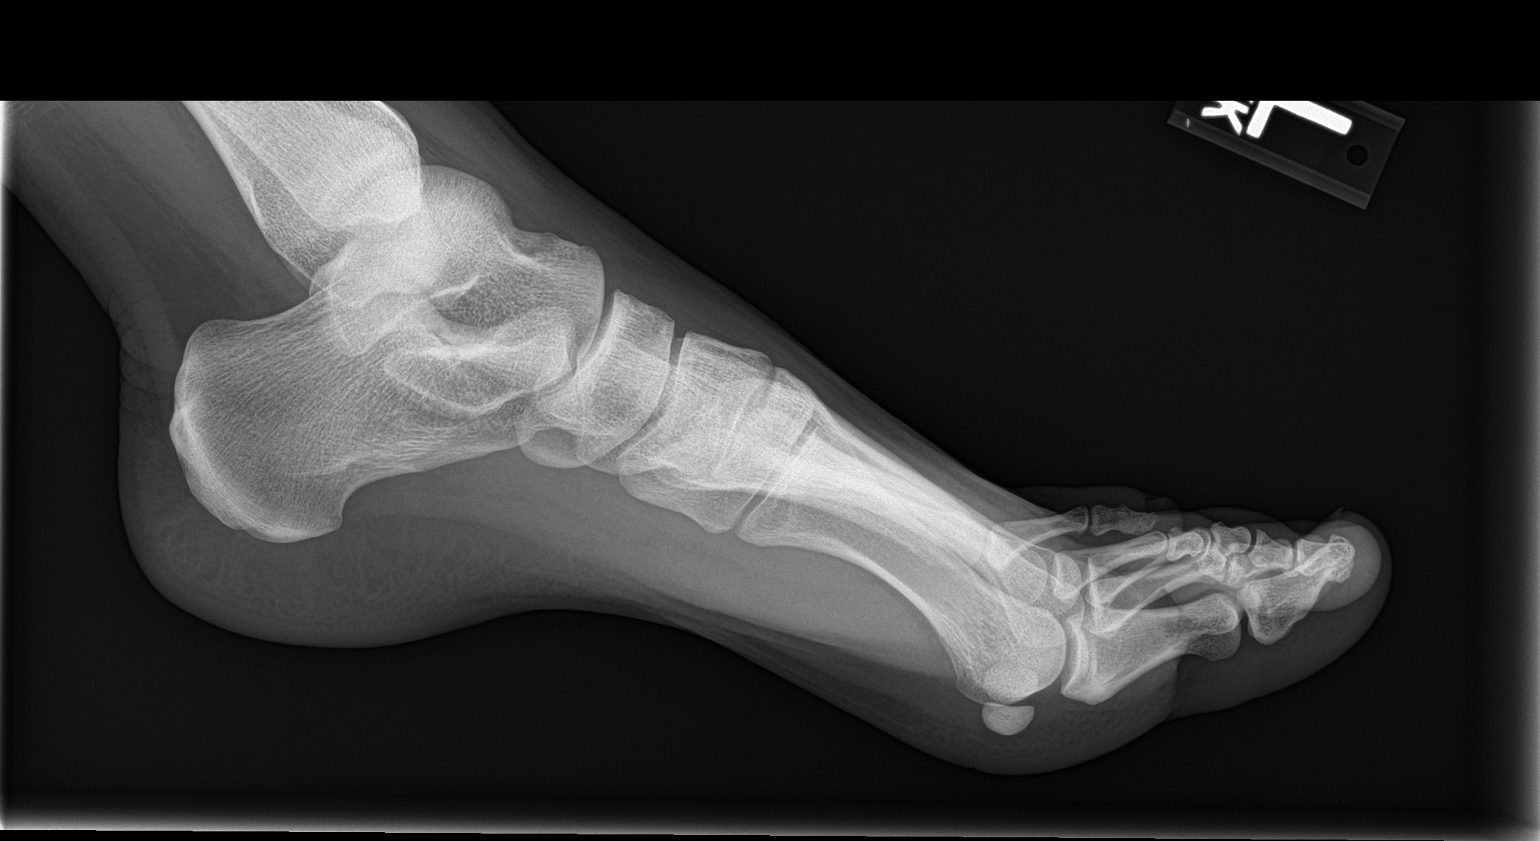

[3 of 3 positions shown; findings below may reference images not displayed]

FINDINGS: There is no evidence of fracture or dislocation. There is no
evidence of arthropathy or other focal bone abnormality. Soft
tissues are unremarkable.
IMPRESSION: Negative.

## 2021-07-26 ENCOUNTER — Other Ambulatory Visit: Payer: Self-pay

## 2021-07-26 ENCOUNTER — Ambulatory Visit
Admission: EM | Admit: 2021-07-26 | Discharge: 2021-07-26 | Disposition: A | Discharge status: Home / Self Care | Payer: Medicaid Other

## 2021-07-26 ENCOUNTER — Encounter: Payer: Self-pay | Admitting: Emergency Medicine

## 2021-07-26 DIAGNOSIS — J019 Acute sinusitis, unspecified: Secondary | ICD-10-CM | POA: Diagnosis not present

## 2021-07-26 DIAGNOSIS — H9203 Otalgia, bilateral: Secondary | ICD-10-CM

## 2021-07-26 MED ORDER — AMOXICILLIN-POT CLAVULANATE 875-125 MG PO TABS: 1.0000 | ORAL_TABLET | Freq: Two times a day (BID) | ORAL | 0 refills | Status: DC

## 2021-07-26 NOTE — ED Triage Notes (Signed)
Saw provider the last week of July and told she had fluid in middle ear of right ear.    Last 3 days has had green sinus drainage and ears are hurting again.  Patient complains of headache

## 2021-07-26 NOTE — ED Provider Notes (Signed)
[facility]    CSN: [id number] Arrival date & time: 07/26/21  1124      History   Chief Complaint Chief Complaint  Patient presents with   URI    HPI Natalie Marshall is a 25 y.o. female presenting for 2-week history of bilateral ear pain and pressure.  Patient states she went to an urgent care and was told she had fluid behind her ears and has been taking decongestants and using nasal sprays without any improvement in symptoms.  She states that over the past 3 days she has developed sinus pain/pressure and thick green nasal drainage.  No fevers but has been fatigued.  Denies cough, sore throat, breathing difficulty.  Patient is type I diabetic.  No other complaints.  HPI  Past Medical History:  Diagnosis Date   Diabetes mellitus type 1, uncomplicated, on long term insulin pump (HCC)    Diabetes mellitus without complication (HCC)     There are no problems to display for this patient.   Past Surgical History:  Procedure Laterality Date   TONSILLECTOMY      OB History   No obstetric history on file.      Home Medications    Prior to Admission medications   Medication Sig Start Date End Date Taking? Authorizing Provider  amoxicillin-clavulanate (AUGMENTIN) 875-125 MG tablet Take 1 tablet by mouth every 12 (twelve) hours for 7 days. 07/26/21 08/02/21 Yes Eusebio Friendly B, PA-C  insulin aspart (NOVOLOG) 100 UNIT/ML injection USE WITH INSULIN PUMP AS DIRECTED. 06/09/19  Yes [provider]  albuterol (VENTOLIN HFA) 108 (90 Base) MCG/ACT inhaler Inhale into the lungs. 10/29/17   [provider]  Continuous Blood Gluc Receiver (DEXCOM G6 RECEIVER) DEVI  04/09/19   [provider]  insulin aspart (NOVOLOG FLEXPEN) 100 UNIT/ML FlexPen Inject into the skin. 07/03/19 07/02/20  [provider]  Levonorgestrel (KYLEENA) 19.5 MG IUD by Intrauterine route. 02/22/17   [provider]  ondansetron (ZOFRAN-ODT) 8 MG disintegrating tablet  Take by mouth. 05/05/19   [provider]    Family History Family History  Problem Relation Age of Onset   Cancer Mother    Healthy Father     Social History Social History   Tobacco Use   Smoking status: Never   Smokeless tobacco: Never  Vaping Use   Vaping Use: Some days  Substance Use Topics   Alcohol use: Not Currently   Drug use: Yes    Types: Marijuana     Allergies   Marine algaes [algae extract] and Pecan extract allergy skin test   Review of Systems Review of Systems  Constitutional:  Positive for fatigue. Negative for chills, diaphoresis and fever.  HENT:  Positive for congestion, ear pain, rhinorrhea, sinus pressure and sinus pain. Negative for sore throat.   Respiratory:  Negative for cough and shortness of breath.   Gastrointestinal:  Negative for abdominal pain, nausea and vomiting.  Musculoskeletal:  Negative for arthralgias and myalgias.  Skin:  Negative for rash.  Neurological:  Positive for headaches. Negative for weakness.  Hematological:  Negative for adenopathy.    Physical Exam Triage Vital Signs ED Triage Vitals  Enc Vitals Group     BP 07/26/21 1233 121/73     Pulse Rate 07/26/21 1233 68     Resp 07/26/21 1233 18     Temp 07/26/21 1233 98.3 F (36.8 C)     Temp Source 07/26/21 1233 Oral     SpO2 07/26/21 1233 100 %  Weight --      Height --      Head Circumference --      Peak Flow --      Pain Score 07/26/21 1229 6     Pain Loc --      Pain Edu? --      Excl. in GC? --    No data found.  Updated Vital Signs BP 121/73 (BP Location: Left Arm)   Pulse 68   Temp 98.3 F (36.8 C) (Oral)   Resp 18   SpO2 100%      Physical Exam Vitals and nursing note reviewed.  Constitutional:      General: She is not in acute distress.    Appearance: Normal appearance. She is not ill-appearing or toxic-appearing.  HENT:     Head: Normocephalic and atraumatic.     Right Ear: Hearing, ear canal and external ear normal. A  middle ear effusion is present.     Left Ear: Hearing, ear canal and external ear normal. A middle ear effusion is present.     Nose: Congestion present.     Right Sinus: Maxillary sinus tenderness present.     Left Sinus: Maxillary sinus tenderness present.     Mouth/Throat:     Mouth: Mucous membranes are moist.     Pharynx: Oropharynx is clear.  Eyes:     General: No scleral icterus.       Right eye: No discharge.        Left eye: No discharge.     Conjunctiva/sclera: Conjunctivae normal.  Cardiovascular:     Rate and Rhythm: Normal rate and regular rhythm.     Heart sounds: Normal heart sounds.  Pulmonary:     Effort: Pulmonary effort is normal. No respiratory distress.     Breath sounds: Normal breath sounds.  Musculoskeletal:     Cervical back: Neck supple.  Skin:    General: Skin is dry.  Neurological:     General: No focal deficit present.     Mental Status: She is alert. Mental status is at baseline.     Motor: No weakness.     Gait: Gait normal.  Psychiatric:        Mood and Affect: Mood normal.        Behavior: Behavior normal.        Thought Content: Thought content normal.     UC Treatments / Results  Labs (all labs ordered are listed, but only abnormal results are displayed) Labs Reviewed - No data to display  EKG   Radiology No results found.  Procedures Procedures (including critical care time)  Medications Ordered in UC Medications - No data to display  Initial Impression / Assessment and Plan / UC Course  I have reviewed the triage vital signs and the nursing notes.  Pertinent labs & imaging results that were available during my care of the patient were reviewed by me and considered in my medical decision making (see chart for details).  25 year old female presenting for 2-week history of bilateral ear pain with 3-day history of sinus pain/pressure and thick green drainage.  Patient has tried multiple OTC medications without improvement in  her symptoms.  Clinical presentation is consistent with developing secondary bacterial sinusitis.  I have sent in Augmentin and advised her to continue decongestants and nasal sprays.  Follow-up with [place] or PCP as needed.  Final Clinical Impressions(s) / UC Diagnoses   Final diagnoses:  Acute sinusitis, recurrence not specified, unspecified  location  Acute ear pain, bilateral     Discharge Instructions      SINUSITIS: Reviewed use of a nasal saline irrigation system. May consider use of intranasal steroids, such as Flonase as well. Use medications as directed. If antibiotics are prescribed, take the full course of antibiotics. Also consider use of Sudafed or Mucinex D. Increase rest, fluids. If you do not improve or if you worsen after a course of antibiotics, you should be re-examined. You may need a different antibiotic or further evaluation with imaging or an exam of the inside of the sinuses     ED Prescriptions     Medication Sig Dispense Auth. Provider   amoxicillin-clavulanate (AUGMENTIN) 875-125 MG tablet Take 1 tablet by mouth every 12 (twelve) hours for 7 days. 14 tablet Gareth Morgan      PDMP not reviewed this encounter.   Shirlee Latch, PA-C 07/26/21 1310

## 2021-07-26 NOTE — Discharge Instructions (Addendum)
SINUSITIS: Reviewed use of a nasal saline irrigation system. May consider use of intranasal steroids, such as Flonase as well. Use medications as directed. If antibiotics are prescribed, take the full course of antibiotics. Also consider use of Sudafed or Mucinex D. Increase rest, fluids. If you do not improve or if you worsen after a course of antibiotics, you should be re-examined. You may need a different antibiotic or further evaluation with imaging or an exam of the inside of the sinuses  °

## 2021-07-27 ENCOUNTER — Telehealth: Payer: Self-pay | Admitting: Physician Assistant

## 2021-07-27 DIAGNOSIS — J019 Acute sinusitis, unspecified: Secondary | ICD-10-CM

## 2021-07-27 MED ORDER — AMOXICILLIN-POT CLAVULANATE 875-125 MG PO TABS: 1.0000 | ORAL_TABLET | Freq: Two times a day (BID) | ORAL | 0 refills | Status: DC

## 2021-07-27 MED ORDER — DOXYCYCLINE HYCLATE 100 MG PO CAPS: 100.0000 mg | ORAL_CAPSULE | Freq: Two times a day (BID) | ORAL | 0 refills | Status: AC

## 2021-07-27 NOTE — Telephone Encounter (Signed)
Augmentin resent to [facility] in [place].  It was sent yesterday but apparently was not there so I have resent it.

## 2021-07-27 NOTE — Telephone Encounter (Signed)
Patient advised to discontinue Augmentin since she had vomiting.  She requested a different.  Send doxycycline to pharmacy.  Nursing staff to contact pharmacy to cancel Augmentin.

## 2021-10-14 ENCOUNTER — Ambulatory Visit
Admission: EM | Admit: 2021-10-14 | Discharge: 2021-10-14 | Disposition: A | Discharge status: Home / Self Care | Payer: Medicaid Other | Attending: Internal Medicine | Admitting: Internal Medicine

## 2021-10-14 ENCOUNTER — Ambulatory Visit: Payer: Medicaid Other

## 2021-10-14 ENCOUNTER — Encounter: Payer: Self-pay | Admitting: Emergency Medicine

## 2021-10-14 ENCOUNTER — Other Ambulatory Visit: Payer: Self-pay

## 2021-10-14 DIAGNOSIS — M778 Other enthesopathies, not elsewhere classified: Secondary | ICD-10-CM | POA: Diagnosis not present

## 2021-10-14 DIAGNOSIS — S60212A Contusion of left wrist, initial encounter: Secondary | ICD-10-CM

## 2021-10-14 NOTE — ED Provider Notes (Signed)
[facility]    CSN: [id number] Arrival date & time: 10/14/21  1349      History   Chief Complaint Chief Complaint  Patient presents with   Wrist Pain    left    HPI Natalie Marshall is a 25 y.o. female who presents with L wrist pain x 1 week. She does not recall injuring herself. Has notiecd a bruse area. She is R hand dominant. Denies repetitive work with her hands. She works at [employer] but does not do the backing.     Past Medical History:  Diagnosis Date   Diabetes mellitus type 1, uncomplicated, on long term insulin pump (HCC)    Diabetes mellitus without complication (HCC)     There are no problems to display for this patient.   Past Surgical History:  Procedure Laterality Date   TONSILLECTOMY      OB History   No obstetric history on file.      Home Medications    Prior to Admission medications   Medication Sig Start Date End Date Taking? Authorizing Provider  albuterol (VENTOLIN HFA) 108 (90 Base) MCG/ACT inhaler Inhale into the lungs. 10/29/17  Yes [provider]  insulin aspart (NOVOLOG) 100 UNIT/ML injection USE WITH INSULIN PUMP AS DIRECTED. 06/09/19  Yes [provider]  Levonorgestrel (KYLEENA) 19.5 MG IUD by Intrauterine route. 02/22/17  Yes [provider]  Continuous Blood Gluc Receiver (DEXCOM G6 RECEIVER) DEVI  04/09/19   [provider]  insulin aspart (NOVOLOG FLEXPEN) 100 UNIT/ML FlexPen Inject into the skin. 07/03/19 07/02/20  [provider]  ondansetron (ZOFRAN-ODT) 8 MG disintegrating tablet Take by mouth. 05/05/19   [provider]    Family History Family History  Problem Relation Age of Onset   Cancer Mother    Healthy Father     Social History Social History   Tobacco Use   Smoking status: Never   Smokeless tobacco: Never  Vaping Use   Vaping Use: Some days  Substance Use Topics   Alcohol use: Not Currently   Drug use: Yes    Types: Marijuana      Allergies   Marine algaes [algae extract], Pecan extract allergy skin test, Capsaicin, and Amoxicillin-pot clavulanate   Review of Systems Review of Systems   Physical Exam Triage Vital Signs ED Triage Vitals [10/14/21 1424]  Enc Vitals Group     BP 124/79     Pulse Rate 71     Resp 18     Temp 98.6 F (37 C)     Temp Source Oral     SpO2 100 %     Weight 143 lb 15.4 oz (65.3 kg)     Height 5' 1.5" (1.562 m)     Head Circumference      Peak Flow      Pain Score 6     Pain Loc      Pain Edu?      Excl. in GC?    No data found.  Updated Vital Signs BP 124/79 (BP Location: Left Arm)   Pulse 71   Temp 98.6 F (37 C) (Oral)   Resp 18   Ht 5' 1.5" (1.562 m)   Wt 143 lb 15.4 oz (65.3 kg)   SpO2 100%   BMI 26.76 kg/m   Visual Acuity Right Eye Distance:   Left Eye Distance:   Bilateral Distance:    Right Eye Near:   Left Eye Near:    Bilateral  Near:     Physical Exam Constitutional:      General: She is not in acute distress.    Appearance: Normal appearance. She is normal weight. She is not toxic-appearing.  HENT:     Head: Normocephalic.     Right Ear: External ear normal.     Left Ear: External ear normal.  Eyes:     General: No scleral icterus.    Conjunctiva/sclera: Conjunctivae normal.  Cardiovascular:     Pulses: Normal pulses.  Pulmonary:     Effort: Pulmonary effort is normal.  Musculoskeletal:        General: Normal range of motion.     Cervical back: Neck supple.  Skin:    General: Skin is warm and dry.     Comments: There is a fainting bruising on dorsal wrist, but no swelling. ROM is normal but flexion provoked pain. Does not have any bone pain.   Neurological:     Mental Status: She is alert and oriented to person, place, and time.     Gait: Gait normal.  Psychiatric:        Mood and Affect: Mood normal.        Behavior: Behavior normal.        Thought Content: Thought content normal.        Judgment: Judgment normal.      UC Treatments / Results  Labs (all labs ordered are listed, but only abnormal results are displayed) Labs Reviewed - No data to display  EKG   Radiology No results found.  Procedures Procedures (including critical care time)  Medications Ordered in UC Medications - No data to display  Initial Impression / Assessment and Plan / UC Course  I have reviewed the triage vital signs and the nursing notes. L wrist contusion/ tendonitis Was placed on a wrist splint . See instructions  Final Clinical Impressions(s) / UC Diagnoses   Final diagnoses:  None   Discharge Instructions   None    ED Prescriptions   None    PDMP not reviewed this encounter.   Garey Ham, PA-C 10/14/21 1439

## 2021-10-14 NOTE — Discharge Instructions (Addendum)
Take Ibuprofen up to 800 mg three times a day for 5-7 days Wear the splint 24/7 for 5-7 days then take it of If not better, follow up with your family doctor or [facility]

## 2021-10-14 NOTE — ED Triage Notes (Signed)
Pt c/o left wrist pain. Started about a week ago. She states she noticed bruising in the area. She states she may have injured it but does not recall.

## 2021-10-26 ENCOUNTER — Ambulatory Visit
Admission: EM | Admit: 2021-10-26 | Discharge: 2021-10-26 | Disposition: A | Discharge status: Home / Self Care | Payer: Medicaid Other | Attending: Emergency Medicine | Admitting: Emergency Medicine

## 2021-10-26 ENCOUNTER — Other Ambulatory Visit: Payer: Self-pay

## 2021-10-26 DIAGNOSIS — H66001 Acute suppurative otitis media without spontaneous rupture of ear drum, right ear: Secondary | ICD-10-CM

## 2021-10-26 DIAGNOSIS — J069 Acute upper respiratory infection, unspecified: Secondary | ICD-10-CM | POA: Diagnosis not present

## 2021-10-26 MED ORDER — IPRATROPIUM BROMIDE 0.06 % NA SOLN: 2.0000 | Freq: Four times a day (QID) | NASAL | 12 refills | Status: DC

## 2021-10-26 MED ORDER — CEFDINIR 300 MG PO CAPS: 300.0000 mg | ORAL_CAPSULE | Freq: Two times a day (BID) | ORAL | 0 refills | Status: AC

## 2021-10-26 NOTE — Discharge Instructions (Signed)
Take the Cefdinir twice daily for 7 days with food for treatment of your ear infection.  Take an over-the-counter probiotic 1 hour after each dose of antibiotic to prevent diarrhea.  Use over-the-counter Tylenol and ibuprofen as needed for pain or fever.  Place a hot water bottle, or heating pad, underneath your pillowcase at night to help dilate up your ear and aid in pain relief as well as resolution of the infection.  Use the Atrovent nasal spray to help you with your nasal congestion. 2 squirts in each nostril every 6 ghours as needed.   Return for reevaluation for any new or worsening symptoms.

## 2021-10-26 NOTE — ED Triage Notes (Signed)
Pt here with C/O right ear pain, and facial pain for 3 days

## 2021-10-26 NOTE — ED Provider Notes (Signed)
[facility]    CSN: [id number] Arrival date & time: 10/26/21  1512      History   Chief Complaint Chief Complaint  Patient presents with   Otalgia    HPI Natalie Marshall is a 25 y.o. female.   HPI  25 year old female here for evaluation of right ear and right facial pain.  Patient reports that she has been experiencing the above symptoms for the past 3 days.  She does endorse some runny nose and nasal congestion but denies fever, sore throat, cough, changes to her hearing, or ringing in her ear.  Patient reports that she frequently gets ear infections this time of year.  She is a type I diabetic and she is concerned about letting an infection go.  Past Medical History:  Diagnosis Date   Diabetes mellitus type 1, uncomplicated, on long term insulin pump (HCC)    Diabetes mellitus without complication (HCC)     There are no problems to display for this patient.   Past Surgical History:  Procedure Laterality Date   TONSILLECTOMY      OB History   No obstetric history on file.      Home Medications    Prior to Admission medications   Medication Sig Start Date End Date Taking? Authorizing Provider  albuterol (VENTOLIN HFA) 108 (90 Base) MCG/ACT inhaler Inhale into the lungs. 10/29/17  Yes [provider]  cefdinir (OMNICEF) 300 MG capsule Take 1 capsule (300 mg total) by mouth 2 (two) times daily for 7 days. 10/26/21 11/02/21 Yes Becky Augusta, NP  Continuous Blood Gluc Receiver (DEXCOM G6 RECEIVER) DEVI  04/09/19  Yes [provider]  insulin aspart (NOVOLOG) 100 UNIT/ML injection USE WITH INSULIN PUMP AS DIRECTED. 06/09/19  Yes [provider]  ipratropium (ATROVENT) 0.06 % nasal spray Place 2 sprays into both nostrils 4 (four) times daily. 10/26/21  Yes Becky Augusta, NP  Levonorgestrel [facility]) 19.5 MG IUD by Intrauterine route. 02/22/17  Yes [provider]  ondansetron (ZOFRAN-ODT) 8 MG disintegrating tablet Take by  mouth. 05/05/19  Yes [provider]  insulin aspart (NOVOLOG FLEXPEN) 100 UNIT/ML FlexPen Inject into the skin. 07/03/19 07/02/20  [provider]    Family History Family History  Problem Relation Age of Onset   Cancer Mother    Healthy Father     Social History Social History   Tobacco Use   Smoking status: Never   Smokeless tobacco: Never  Vaping Use   Vaping Use: Some days  Substance Use Topics   Alcohol use: Not Currently   Drug use: Yes    Types: Marijuana     Allergies   Marine algaes [algae extract], Pecan extract allergy skin test, Capsaicin, and Amoxicillin-pot clavulanate   Review of Systems Review of Systems  Constitutional:  Negative for activity change, appetite change and fever.  HENT:  Positive for congestion, ear pain and rhinorrhea. Negative for ear discharge, hearing loss, sore throat and tinnitus.   Respiratory:  Negative for cough.   Musculoskeletal:  Negative for arthralgias and myalgias.  Skin:  Negative for rash.  Hematological: Negative.   Psychiatric/Behavioral: Negative.      Physical Exam Triage Vital Signs ED Triage Vitals  Enc Vitals Group     BP 10/26/21 1553 125/90     Pulse Rate 10/26/21 1553 68     Resp 10/26/21 1553 18     Temp 10/26/21 1553 98.4 F (36.9 C)     Temp Source 10/26/21 1553 Oral  SpO2 10/26/21 1553 100 %     Weight 10/26/21 1552 142 lb (64.4 kg)     Height 10/26/21 1552 5' 1.5" (1.562 m)     Head Circumference --      Peak Flow --      Pain Score 10/26/21 1551 5     Pain Loc --      Pain Edu? --      Excl. in GC? --    No data found.  Updated Vital Signs BP 125/90 (BP Location: Left Arm)   Pulse 68   Temp 98.4 F (36.9 C) (Oral)   Resp 18   Ht 5' 1.5" (1.562 m)   Wt 142 lb (64.4 kg)   SpO2 100%   BMI 26.40 kg/m   Visual Acuity Right Eye Distance:   Left Eye Distance:   Bilateral Distance:    Right Eye Near:   Left Eye Near:    Bilateral Near:     Physical  Exam Vitals and nursing note reviewed.  Constitutional:      General: She is not in acute distress.    Appearance: Normal appearance. She is normal weight. She is not ill-appearing.  HENT:     Head: Normocephalic and atraumatic.     Right Ear: Ear canal and external ear normal. There is no impacted cerumen.     Left Ear: Tympanic membrane, ear canal and external ear normal. There is no impacted cerumen.     Ears:     Comments: TM is erythematous with a milky effusion in the middle ear.    Nose: Congestion and rhinorrhea present.     Mouth/Throat:     Mouth: Mucous membranes are moist.     Pharynx: Oropharynx is clear. No posterior oropharyngeal erythema.  Cardiovascular:     Rate and Rhythm: Normal rate and regular rhythm.     Pulses: Normal pulses.     Heart sounds: Normal heart sounds. No murmur heard.   No gallop.  Pulmonary:     Effort: Pulmonary effort is normal.     Breath sounds: Normal breath sounds. No wheezing, rhonchi or rales.  Musculoskeletal:     Cervical back: Normal range of motion and neck supple.  Lymphadenopathy:     Cervical: Cervical adenopathy present.  Skin:    General: Skin is warm and dry.     Capillary Refill: Capillary refill takes less than 2 seconds.     Findings: No erythema or rash.  Neurological:     General: No focal deficit present.     Mental Status: She is alert and oriented to person, place, and time.  Psychiatric:        Mood and Affect: Mood normal.        Behavior: Behavior normal.        Thought Content: Thought content normal.        Judgment: Judgment normal.     UC Treatments / Results  Labs (all labs ordered are listed, but only abnormal results are displayed) Labs Reviewed - No data to display  EKG   Radiology No results found.  Procedures Procedures (including critical care time)  Medications Ordered in UC Medications - No data to display  Initial Impression / Assessment and Plan / UC Course  I have reviewed  the triage vital signs and the nursing notes.  Pertinent labs & imaging results that were available during my care of the patient were reviewed by me and considered in my medical decision making (  see chart for details).  Patient is a very pleasant, nontoxic-appearing 25 year old female here for right ear pain and which she describes as right facial pain.  When she says she has facial pain she is actually referring to pain down the right side of her neck and feeling swollen lymph nodes in her neck.  Patient's physical exam reveals pearly gray tympanic membrane on the left with a normal light reflex and clear external auditory canal.  The right tympanic membrane is erythematous with a milky effusion in the middle ear.  Nasal mucosa is erythematous and edematous with clear nasal discharge.  Oropharyngeal exam is benign.  Patient does have bilateral anterior cervical lymphadenopathy on exam.  She also has tenderness when palpating the right eustachian tube externally.  Patient's cardiopulmonary exam reveals clear lung sounds in all fields.  Patient's exam is consistent with an upper respiratory infection and otitis media.  We will treat patient with cefdinir twice daily for 7 days.  Patient has an allergy to Augmentin but says she can take cefdinir just fine.  She cannot take the Augmentin because there is an algae component which she is allergic to.  I will also give the patient Atrovent nasal spray to help with her nasal congestion.  Work note provided.   Final Clinical Impressions(s) / UC Diagnoses   Final diagnoses:  Non-recurrent acute suppurative otitis media of right ear without spontaneous rupture of tympanic membrane  Upper respiratory tract infection, unspecified type     Discharge Instructions      Take the Cefdinir twice daily for 7 days with food for treatment of your ear infection.  Take an over-the-counter probiotic 1 hour after each dose of antibiotic to prevent diarrhea.  Use  over-the-counter Tylenol and ibuprofen as needed for pain or fever.  Place a hot water bottle, or heating pad, underneath your pillowcase at night to help dilate up your ear and aid in pain relief as well as resolution of the infection.  Use the Atrovent nasal spray to help you with your nasal congestion. 2 squirts in each nostril every 6 ghours as needed.   Return for reevaluation for any new or worsening symptoms.      ED Prescriptions     Medication Sig Dispense Auth. Provider   cefdinir (OMNICEF) 300 MG capsule Take 1 capsule (300 mg total) by mouth 2 (two) times daily for 7 days. 14 capsule Becky Augusta, NP   ipratropium (ATROVENT) 0.06 % nasal spray Place 2 sprays into both nostrils 4 (four) times daily. 15 mL Becky Augusta, NP      PDMP not reviewed this encounter.   Becky Augusta, NP 10/26/21 [phone]

## 2022-08-30 ENCOUNTER — Ambulatory Visit
Admission: EM | Admit: 2022-08-30 | Discharge: 2022-08-30 | Disposition: A | Discharge status: Home / Self Care | Payer: Medicaid Other | Attending: Emergency Medicine | Admitting: Emergency Medicine

## 2022-08-30 DIAGNOSIS — J069 Acute upper respiratory infection, unspecified: Secondary | ICD-10-CM

## 2022-08-30 DIAGNOSIS — Z20822 Contact with and (suspected) exposure to covid-19: Secondary | ICD-10-CM | POA: Diagnosis not present

## 2022-08-30 DIAGNOSIS — J029 Acute pharyngitis, unspecified: Secondary | ICD-10-CM | POA: Diagnosis not present

## 2022-08-30 DIAGNOSIS — R059 Cough, unspecified: Secondary | ICD-10-CM | POA: Diagnosis present

## 2022-08-30 LAB — SARS CORONAVIRUS 2 BY RT PCR: SARS Coronavirus 2 by RT PCR: NEGATIVE

## 2022-08-30 MED ORDER — IPRATROPIUM BROMIDE 0.06 % NA SOLN: 2.0000 | Freq: Four times a day (QID) | NASAL | 12 refills | Status: AC

## 2022-08-30 MED ORDER — PROMETHAZINE-DM 6.25-15 MG/5ML PO SYRP: 5.0000 mL | ORAL_SOLUTION | Freq: Four times a day (QID) | ORAL | 0 refills | Status: AC | PRN

## 2022-08-30 MED ORDER — BENZONATATE 100 MG PO CAPS: 200.0000 mg | ORAL_CAPSULE | Freq: Three times a day (TID) | ORAL | 0 refills | Status: AC

## 2022-08-30 NOTE — Discharge Instructions (Signed)
Your COVID test today was negative.  You do have symptoms of a respiratory infection and I believe it is most likely viral.  Please use the following medications to help your symptoms.  Use the Atrovent nasal spray, 2 squirts in each nostril every 6 hours, as needed for runny nose and postnasal drip.  Use the Tessalon Perles every 8 hours during the day.  Take them with a small sip of water.  They may give you some numbness to the base of your tongue or a metallic taste in your mouth, this is normal.  Use the Promethazine DM cough syrup at bedtime for cough and congestion.  It will make you drowsy so do not take it during the day.  Return for reevaluation or see your primary care provider for any new or worsening symptoms.

## 2022-08-30 NOTE — ED Triage Notes (Signed)
Patient reports to UC for exposure to Covid.   Patient reports that she headache, sore throat, and ear pain -- symptoms started yesterday

## 2022-08-30 NOTE — ED Provider Notes (Signed)
[facility]    CSN: [id number] Arrival date & time: 08/30/22  1219      History   Chief Complaint Chief Complaint  Patient presents with   Headache   Sore Throat   Otalgia    HPI Natalie Marshall is a 26 y.o. female.   HPI  26 year old female here for evaluation of respiratory complaints.  Patient's Natalie Marshall is currently COVID-positive and she developed symptoms yesterday to include headache, ear pain, sore throat, postnasal drip, nonproductive cough, some nausea, and intermittent diarrhea.  She has not had any measured fever, shortness of breath, or wheezing.  Past Medical History:  Diagnosis Date   Diabetes mellitus type 1, uncomplicated, on long term insulin pump (HCC)    Diabetes mellitus without complication (HCC)     There are no problems to display for this patient.   Past Surgical History:  Procedure Laterality Date   TONSILLECTOMY      OB History   No obstetric history on file.      Home Medications    Prior to Admission medications   Medication Sig Start Date End Date Taking? Authorizing Provider  benzonatate (TESSALON) 100 MG capsule Take 2 capsules (200 mg total) by mouth every 8 (eight) hours. 08/30/22  Yes Becky Augusta, NP  ipratropium (ATROVENT) 0.06 % nasal spray Place 2 sprays into both nostrils 4 (four) times daily. 08/30/22  Yes Becky Augusta, NP  promethazine-dextromethorphan (PROMETHAZINE-DM) 6.25-15 MG/5ML syrup Take 5 mLs by mouth 4 (four) times daily as needed. 08/30/22  Yes Becky Augusta, NP  albuterol (VENTOLIN HFA) 108 (90 Base) MCG/ACT inhaler Inhale into the lungs. 10/29/17   [provider]  Continuous Blood Gluc Receiver (DEXCOM G6 RECEIVER) DEVI  04/09/19   [provider]  insulin aspart (NOVOLOG FLEXPEN) 100 UNIT/ML FlexPen Inject into the skin. 07/03/19 07/02/20  [provider]  insulin aspart (NOVOLOG) 100 UNIT/ML injection USE WITH INSULIN PUMP AS DIRECTED. 06/09/19   [provider]   Levonorgestrel (KYLEENA) 19.5 MG IUD by Intrauterine route. 02/22/17   [provider]  ondansetron (ZOFRAN-ODT) 8 MG disintegrating tablet Take by mouth. 05/05/19   [provider]    Family History Family History  Problem Relation Age of Onset   Cancer Mother    Healthy Father     Social History Social History   Tobacco Use   Smoking status: Never   Smokeless tobacco: Never  Vaping Use   Vaping Use: Some days  Substance Use Topics   Alcohol use: Not Currently   Drug use: Yes    Types: Marijuana     Allergies   Marine algaes [algae extract], Pecan extract allergy skin test, Capsaicin, and Amoxicillin-pot clavulanate   Review of Systems Review of Systems  Constitutional:  Negative for fever.  HENT:  Positive for congestion, postnasal drip, rhinorrhea and sore throat.   Respiratory:  Positive for cough. Negative for shortness of breath and wheezing.   Gastrointestinal:  Positive for diarrhea and nausea. Negative for vomiting.  Neurological:  Positive for headaches.  Hematological: Negative.   Psychiatric/Behavioral: Negative.       Physical Exam Triage Vital Signs ED Triage Vitals  Enc Vitals Group     BP 08/30/22 1236 128/87     Pulse Rate 08/30/22 1236 71     Resp --      Temp 08/30/22 1236 99.3 F (37.4 C)     Temp Source 08/30/22 1236 Oral     SpO2 08/30/22 1236 100 %  Weight 08/30/22 1234 130 lb (59 kg)     Height 08/30/22 1234 5\' 2"  (1.575 m)     Head Circumference --      Peak Flow --      Pain Score 08/30/22 1233 3     Pain Loc --      Pain Edu? --      Excl. in GC? --    No data found.  Updated Vital Signs BP 128/87 (BP Location: Left Arm)   Pulse 71   Temp 99.3 F (37.4 C) (Oral)   Ht 5\' 2"  (1.575 m)   Wt 130 lb (59 kg)   LMP 08/29/2022 (Exact Date)   SpO2 100%   BMI 23.78 kg/m   Visual Acuity Right Eye Distance:   Left Eye Distance:   Bilateral Distance:    Right Eye Near:   Left Eye Near:    Bilateral  Near:     Physical Exam Vitals and nursing note reviewed.  Constitutional:      Appearance: Normal appearance. She is not ill-appearing.  HENT:     Head: Normocephalic and atraumatic.     Right Ear: Tympanic membrane, ear canal and external ear normal. There is no impacted cerumen.     Left Ear: Tympanic membrane, ear canal and external ear normal. There is no impacted cerumen.     Nose: Congestion and rhinorrhea present.     Mouth/Throat:     Mouth: Mucous membranes are moist.     Pharynx: Oropharynx is clear. Posterior oropharyngeal erythema present. No oropharyngeal exudate.  Cardiovascular:     Rate and Rhythm: Normal rate and regular rhythm.     Pulses: Normal pulses.     Heart sounds: Normal heart sounds. No murmur heard.    No friction rub. No gallop.  Pulmonary:     Effort: Pulmonary effort is normal.     Breath sounds: Normal breath sounds. No wheezing, rhonchi or rales.  Musculoskeletal:     Cervical back: Normal range of motion and neck supple.  Lymphadenopathy:     Cervical: No cervical adenopathy.  Skin:    General: Skin is warm and dry.     Capillary Refill: Capillary refill takes less than 2 seconds.     Findings: No rash.  Neurological:     General: No focal deficit present.     Mental Status: She is alert and oriented to person, place, and time.  Psychiatric:        Mood and Affect: Mood normal.        Behavior: Behavior normal.        Thought Content: Thought content normal.        Judgment: Judgment normal.      UC Treatments / Results  Labs (all labs ordered are listed, but only abnormal results are displayed) Labs Reviewed  SARS CORONAVIRUS 2 BY RT PCR    EKG   Radiology No results found.  Procedures Procedures (including critical care time)  Medications Ordered in UC Medications - No data to display  Initial Impression / Assessment and Plan / UC Course  I have reviewed the triage vital signs and the nursing notes.  Pertinent labs &  imaging results that were available during my care of the patient were reviewed by me and considered in my medical decision making (see chart for details).   Patient is a pleasant, nontoxic appearing 26 year old female here for evaluation of possible COVID after being exposed to her boyfriend who is COVID-positive.  She does have symptoms that developed yesterday to include headache, ear pain, sore throat, postnasal drip, nonproductive cough, intermittent nausea, and intermittent diarrhea.  She has taken Tylenol and Advil for the headache and achiness without any significant improvement of symptoms.  She has not had a measured fever but states she did feel feverish all day yesterday.  No shortness of breath or wheezing.  She does have a significant past medical history to include type 1 diabetes.  Her physical exam reveals pearly-gray tympanic membranes bilaterally with normal light reflex and clear external auditory canals.  Nasal mucosa is erythematous and edematous with clear discharge in both nares.  Oropharyngeal exam reveals posterior oropharyngeal erythema and injection with clear postnasal drip.  Tonsillar pillars are unremarkable.  No anterior cervical lymphadenopathy on exam.  Cardiopulmonary exam reveals clear lung sounds in all fields and S1-S2 heart sounds with regular rate and rhythm.  I will order a COVID PCR.  COVID PCR is negative.  I will discharge patient with a diagnosis of viral URI with cough and treat her symptoms with extra nasal spray, Tessalon Perles, Promethazine DM cough syrup.  Work note provided.  Return precautions reviewed.   Final Clinical Impressions(s) / UC Diagnoses   Final diagnoses:  Viral URI with cough     Discharge Instructions      Your COVID test today was negative.  You do have symptoms of a respiratory infection and I believe it is most likely viral.  Please use the following medications to help your symptoms.  Use the Atrovent nasal spray, 2 squirts  in each nostril every 6 hours, as needed for runny nose and postnasal drip.  Use the Tessalon Perles every 8 hours during the day.  Take them with a small sip of water.  They may give you some numbness to the base of your tongue or a metallic taste in your mouth, this is normal.  Use the Promethazine DM cough syrup at bedtime for cough and congestion.  It will make you drowsy so do not take it during the day.  Return for reevaluation or see your primary care provider for any new or worsening symptoms.      ED Prescriptions     Medication Sig Dispense Auth. Provider   benzonatate (TESSALON) 100 MG capsule Take 2 capsules (200 mg total) by mouth every 8 (eight) hours. 21 capsule Becky Augusta, NP   ipratropium (ATROVENT) 0.06 % nasal spray Place 2 sprays into both nostrils 4 (four) times daily. 15 mL Becky Augusta, NP   promethazine-dextromethorphan (PROMETHAZINE-DM) 6.25-15 MG/5ML syrup Take 5 mLs by mouth 4 (four) times daily as needed. 118 mL Becky Augusta, NP      PDMP not reviewed this encounter.   Becky Augusta, NP 08/30/22 1323

## 2022-09-07 ENCOUNTER — Ambulatory Visit
Admission: EM | Admit: 2022-09-07 | Discharge: 2022-09-07 | Disposition: A | Discharge status: Home / Self Care | Payer: Medicaid Other | Attending: Emergency Medicine | Admitting: Emergency Medicine

## 2022-09-07 ENCOUNTER — Ambulatory Visit (INDEPENDENT_AMBULATORY_CARE_PROVIDER_SITE_OTHER): Payer: Medicaid Other

## 2022-09-07 DIAGNOSIS — R059 Cough, unspecified: Secondary | ICD-10-CM

## 2022-09-07 DIAGNOSIS — U071 COVID-19: Secondary | ICD-10-CM | POA: Insufficient documentation

## 2022-09-07 DIAGNOSIS — R112 Nausea with vomiting, unspecified: Secondary | ICD-10-CM | POA: Diagnosis present

## 2022-09-07 DIAGNOSIS — J0140 Acute pansinusitis, unspecified: Secondary | ICD-10-CM | POA: Diagnosis not present

## 2022-09-07 DIAGNOSIS — R197 Diarrhea, unspecified: Secondary | ICD-10-CM | POA: Diagnosis present

## 2022-09-07 LAB — COMPREHENSIVE METABOLIC PANEL
ALT: 13 U/L (ref 0–44)
AST: 20 U/L (ref 15–41)
Albumin: 4.5 g/dL (ref 3.5–5.0)
Alkaline Phosphatase: 61 U/L (ref 38–126)
Anion gap: 6 (ref 5–15)
BUN: 15 mg/dL (ref 6–20)
CO2: 25 mmol/L (ref 22–32)
Calcium: 9.1 mg/dL (ref 8.9–10.3)
Chloride: 102 mmol/L (ref 98–111)
Creatinine, Ser: 0.69 mg/dL (ref 0.44–1.00)
GFR, Estimated: >60 mL/min (ref >60)
Glucose, Bld: 242 mg/dL — ABNORMAL HIGH (ref 70–99)
Potassium: 3.9 mmol/L (ref 3.5–5.1)
Sodium: 133 mmol/L — ABNORMAL LOW (ref 135–145)
Total Bilirubin: 0.3 mg/dL (ref 0.3–1.2)
Total Protein: 8 g/dL (ref 6.5–8.1)

## 2022-09-07 LAB — URINALYSIS, ROUTINE W REFLEX MICROSCOPIC
Bilirubin Urine: NEGATIVE
Glucose, UA: >1000 mg/dL — AB
Ketones, ur: 80 mg/dL — AB
Leukocytes,Ua: NEGATIVE
Nitrite: NEGATIVE
Protein, ur: NEGATIVE mg/dL
Specific Gravity, Urine: 1.015 (ref 1.005–1.030)
pH: 6 (ref 5.0–8.0)

## 2022-09-07 LAB — CBC WITH DIFFERENTIAL/PLATELET
Abs Immature Granulocytes: 0.02 10*3/uL (ref 0.00–0.07)
Basophils Absolute: 0 10*3/uL (ref 0.0–0.1)
Basophils Relative: 1 %
Eosinophils Absolute: 0 10*3/uL (ref 0.0–0.5)
Eosinophils Relative: 1 %
HCT: 39.8 % (ref 36.0–46.0)
Hemoglobin: 13.6 g/dL (ref 12.0–15.0)
Immature Granulocytes: 0 %
Lymphocytes Relative: 26 %
Lymphs Abs: 1.8 10*3/uL (ref 0.7–4.0)
MCH: 29.7 pg (ref 26.0–34.0)
MCHC: 34.2 g/dL (ref 30.0–36.0)
MCV: 86.9 fL (ref 80.0–100.0)
Monocytes Absolute: 0.3 10*3/uL (ref 0.1–1.0)
Monocytes Relative: 4 %
Neutro Abs: 4.7 10*3/uL (ref 1.7–7.7)
Neutrophils Relative %: 68 %
Platelets: 328 10*3/uL (ref 150–400)
RBC: 4.58 MIL/uL (ref 3.87–5.11)
RDW: 13.3 % (ref 11.5–15.5)
WBC: 6.9 10*3/uL (ref 4.0–10.5)
nRBC: 0 % (ref 0.0–0.2)

## 2022-09-07 LAB — LIPASE, BLOOD: Lipase: 22 U/L (ref 11–51)

## 2022-09-07 LAB — RESP PANEL BY RT-PCR (FLU A&B, COVID) ARPGX2
Influenza A by PCR: NEGATIVE
Influenza B by PCR: NEGATIVE
SARS Coronavirus 2 by RT PCR: POSITIVE — AB

## 2022-09-07 LAB — GROUP A STREP BY PCR: Group A Strep by PCR: NOT DETECTED

## 2022-09-07 LAB — URINALYSIS, MICROSCOPIC (REFLEX): WBC, UA: NONE SEEN WBC/hpf (ref 0–5)

## 2022-09-07 LAB — PREGNANCY, URINE: Preg Test, Ur: NEGATIVE

## 2022-09-07 MED ORDER — ONDANSETRON 4 MG PO TBDP
4.0000 mg | ORAL_TABLET | Freq: Once | ORAL | Status: AC
  Administered 2022-09-07: 4 mg via ORAL

## 2022-09-07 MED ORDER — AEROCHAMBER MV MISC: 1 refills | Status: AC

## 2022-09-07 MED ORDER — DOXYCYCLINE HYCLATE 100 MG PO CAPS: 100.0000 mg | ORAL_CAPSULE | Freq: Two times a day (BID) | ORAL | 0 refills | Status: AC

## 2022-09-07 MED ORDER — ONDANSETRON 4 MG PO TBDP: 4.0000 mg | ORAL_TABLET | Freq: Three times a day (TID) | ORAL | 0 refills | Status: DC | PRN

## 2022-09-07 MED ORDER — NIRMATRELVIR/RITONAVIR (PAXLOVID)TABLET: 3.0000 | ORAL_TABLET | Freq: Two times a day (BID) | ORAL | 0 refills | Status: AC

## 2022-09-07 NOTE — Discharge Instructions (Addendum)
Your labs are unremarkable except for glucose of 242.  Continue taking Zofran.  This may make you constipated.  Push electrolyte containing fluids such as Pedialyte, liquid IV or Gatorade.  You do not have any evidence of DKA here, although you have ketones in your urine.  I suspect the ketones in your urine are from dehydration.  Go to the emergency department if your glucose goes above 250, you have increased thirst/increased urination, unintentional weight loss, if your symptoms get worse, or for any other concerns.  Your chest x-ray was also negative for pneumonia, however I am covering you with doxycycline to treat a sinus infection.  This will also treat a pneumonia that may not show up on the x-ray.  Finish the antibiotics, even if you feel better.  2 puffs from your albuterol inhaler using your spacer every 4 hours as needed.

## 2022-09-07 NOTE — ED Triage Notes (Addendum)
Pt  seen 08/30/22- pt was exposed to covid from her boyfriend, pt had negative PCR test.  Pt states she woke up this morning w/ emesis, diarrhea. Pt states she had cough when she was seen but cough now more productive. Pt states she did finish medication for cough given 08/30/22. Pt took home covid test Saturday (negative) & Tuesday (negative). Pt reports new onset sxs sore throat, hurts to swallow

## 2022-09-07 NOTE — ED Provider Notes (Signed)
HPI  SUBJECTIVE:  Natalie Marshall is a 26 y.o. female who presents with 10-15 episodes of nonbloody, nonbilious emesis, nausea, watery, nonbloody diarrhea starting this morning.  She has had a sore throat for 3 days, but states it became worse after that vomiting.  She reports body aches, headaches, nasal congestion, clear rhinorrhea, sinus pain and pressure, bilateral ear pain, postnasal drip.  She reports wheezing accompanied with shortness of breath in the morning and at night.  No chest pain.  No change in hearing, otorrhea.  No antibiotics in the past 3 months.  No antipyretic in the past 6 hours.  She states that she has been sick for 2 weeks, was getting better, and then got worse starting several days ago.  She also reports seeing trace ketones in the urine dipstick at home.  She has tried ibuprofen 400 to 600 mg, Tessalon, Promethazine DM, and is using her albuterol as needed.  She states she is not really needing this.  No alleviating factors.  Symptoms are worse with vomiting.  No raw undercooked foods, questionable leftovers, abdominal distention, change in urine output, alcohol last night.  No urinary complaints.  She was seen here on 9/14 for headache, ear pain, sore throat, postnasal drip, nonproductive cough, nausea and intermittent diarrhea. COVID PCR was negative.  She was sent home with Atrovent nasal spray, Tessalon Perles, Promethazine DM cough syrup.  Her boyfriend had COVID last week, but she has had 2 negative home COVID test.  Patient has a past medical history of diabetes type 1, DKA, seasonal asthma, and is status post tonsillectomy.  Normally glucose is below 160.  States it was in the low 200s this morning.  LMP: 3 to 4 days ago.  Not sure if she could be pregnant.  PCP: [facility].  Past Medical History:  Diagnosis Date   Diabetes mellitus type 1, uncomplicated, on long term insulin pump (HCC)    Diabetes mellitus without complication (HCC)     Past Surgical  History:  Procedure Laterality Date   TONSILLECTOMY      Family History  Problem Relation Age of Onset   Cancer Mother    Healthy Father     Social History   Tobacco Use   Smoking status: Never   Smokeless tobacco: Never  Vaping Use   Vaping Use: Some days  Substance Use Topics   Alcohol use: Not Currently   Drug use: Yes    Types: Marijuana    No current facility-administered medications for this encounter.  Current Outpatient Medications:    Continuous Blood Gluc Receiver (DEXCOM G6 RECEIVER) DEVI, , Disp: , Rfl:    doxycycline (VIBRAMYCIN) 100 MG capsule, Take 1 capsule (100 mg total) by mouth 2 (two) times daily for 10 days., Disp: 20 capsule, Rfl: 0   insulin aspart (NOVOLOG FLEXPEN) 100 UNIT/ML FlexPen, Inject into the skin., Disp: , Rfl:    insulin aspart (NOVOLOG) 100 UNIT/ML injection, USE WITH INSULIN PUMP AS DIRECTED., Disp: , Rfl:    ipratropium (ATROVENT) 0.06 % nasal spray, Place 2 sprays into both nostrils 4 (four) times daily., Disp: 15 mL, Rfl: 12   Levonorgestrel (KYLEENA) 19.5 MG IUD, by Intrauterine route., Disp: , Rfl:    ondansetron (ZOFRAN-ODT) 4 MG disintegrating tablet, Take 1 tablet (4 mg total) by mouth every 8 (eight) hours as needed for nausea or vomiting., Disp: 20 tablet, Rfl: 0   Spacer/Aero-Holding Chambers (AEROCHAMBER MV) inhaler, Use as instructed, Disp: 1 each, Rfl: 1  albuterol (VENTOLIN HFA) 108 (90 Base) MCG/ACT inhaler, Inhale into the lungs., Disp: , Rfl:    benzonatate (TESSALON) 100 MG capsule, Take 2 capsules (200 mg total) by mouth every 8 (eight) hours., Disp: 21 capsule, Rfl: 0   promethazine-dextromethorphan (PROMETHAZINE-DM) 6.25-15 MG/5ML syrup, Take 5 mLs by mouth 4 (four) times daily as needed., Disp: 118 mL, Rfl: 0  Allergies  Allergen Reactions   Marine Algaes [Algae Extract] Swelling    Itching and swelling    Pecan Extract Allergy Skin Test Hives   Capsaicin Rash    Per patient report   Amoxicillin-Pot Clavulanate  Nausea And Vomiting     ROS  As noted in HPI.   Physical Exam  BP (!) 129/90 (BP Location: Left Arm)   Pulse 90   Temp 98.3 F (36.8 C) (Oral)   Ht 5\' 2"  (1.575 m)   Wt 59 kg   LMP 08/29/2022 (Exact Date)   SpO2 99%   BMI 23.78 kg/m   Constitutional: Well developed, well nourished, no acute distress Eyes:  EOMI, conjunctiva normal bilaterally HENT: Normocephalic, atraumatic,mucus membranes moist.  TMs normal bilaterally.  Mild nasal congestion.  Normal turbinates.  Positive maxillary and frontal sinus tenderness.  Tonsils surgically absent.  No postnasal drip. Neck: Positive cervical lymphadenopathy Respiratory: Normal inspiratory effort, lungs clear bilaterally Cardiovascular: Normal rate, regular rhythm, no murmurs rubs or gallops.  Cap refill less than 2 seconds. GI: nondistended soft.  Mild periumbilical tenderness.  No guarding, rebound.  Hypoactive bowel sounds.  Negative Murphy, negative McBurney. Back: No CVAT skin: No rash, skin intact Musculoskeletal: no deformities Neurologic: Alert & oriented x 3, no focal neuro deficits Psychiatric: Speech and behavior appropriate   ED Course   Medications  ondansetron (ZOFRAN-ODT) disintegrating tablet 4 mg (4 mg Oral Given 09/07/22 1044)    Orders Placed This Encounter  Procedures   Group A Strep by PCR    Standing Status:   Standing    Number of Occurrences:   1   Resp Panel by RT-PCR (Flu A&B, Covid) Anterior Nasal Swab    Standing Status:   Standing    Number of Occurrences:   1    Order Specific Question:   Patient immune status    Answer:   Immunocompromised    Order Specific Question:   Release to patient    Answer:   Immediate   DG Chest 2 View    Standing Status:   Standing    Number of Occurrences:   1    Order Specific Question:   Reason for Exam (SYMPTOM  OR DIAGNOSIS REQUIRED)    Answer:   Productive cough x2 weeks rule out pneumonia   Pregnancy, urine    Standing Status:   Standing    Number of  Occurrences:   1   Urinalysis, Routine w reflex microscopic Urine, Clean Catch    Standing Status:   Standing    Number of Occurrences:   1   Lipase, blood    Standing Status:   Standing    Number of Occurrences:   1   CBC with Differential    Standing Status:   Standing    Number of Occurrences:   1   Comprehensive metabolic panel    Standing Status:   Standing    Number of Occurrences:   1   Urinalysis, Microscopic (reflex)    Standing Status:   Standing    Number of Occurrences:   1    Results  for orders placed or performed during the hospital encounter of 09/07/22 (from the past 24 hour(s))  Group A Strep by PCR     Status: None   Collection Time: 09/07/22  9:54 AM   Specimen: Throat; Sterile Swab  Result Value Ref Range   Group A Strep by PCR NOT DETECTED NOT DETECTED  Pregnancy, urine     Status: None   Collection Time: 09/07/22 10:47 AM  Result Value Ref Range   Preg Test, Ur NEGATIVE NEGATIVE  Urinalysis, Routine w reflex microscopic Urine, Clean Catch     Status: Abnormal   Collection Time: 09/07/22 10:47 AM  Result Value Ref Range   Color, Urine YELLOW YELLOW   APPearance CLEAR CLEAR   Specific Gravity, Urine 1.015 1.005 - 1.030   pH 6.0 5.0 - 8.0   Glucose, UA >1,000 (A) NEGATIVE mg/dL   Hgb urine dipstick SMALL (A) NEGATIVE   Bilirubin Urine NEGATIVE NEGATIVE   Ketones, ur 80 (A) NEGATIVE mg/dL   Protein, ur NEGATIVE NEGATIVE mg/dL   Nitrite NEGATIVE NEGATIVE   Leukocytes,Ua NEGATIVE NEGATIVE  Lipase, blood     Status: None   Collection Time: 09/07/22 10:47 AM  Result Value Ref Range   Lipase 22 11 - 51 U/L  CBC with Differential     Status: None   Collection Time: 09/07/22 10:47 AM  Result Value Ref Range   WBC 6.9 4.0 - 10.5 K/uL   RBC 4.58 3.87 - 5.11 MIL/uL   Hemoglobin 13.6 12.0 - 15.0 g/dL   HCT 84.139.8 66.036.0 - 63.046.0 %   MCV 86.9 80.0 - 100.0 fL   MCH 29.7 26.0 - 34.0 pg   MCHC 34.2 30.0 - 36.0 g/dL   RDW 16.013.3 10.911.5 - 32.315.5 %   Platelets 328 150 -  400 K/uL   nRBC 0.0 0.0 - 0.2 %   Neutrophils Relative % 68 %   Neutro Abs 4.7 1.7 - 7.7 K/uL   Lymphocytes Relative 26 %   Lymphs Abs 1.8 0.7 - 4.0 K/uL   Monocytes Relative 4 %   Monocytes Absolute 0.3 0.1 - 1.0 K/uL   Eosinophils Relative 1 %   Eosinophils Absolute 0.0 0.0 - 0.5 K/uL   Basophils Relative 1 %   Basophils Absolute 0.0 0.0 - 0.1 K/uL   Immature Granulocytes 0 %   Abs Immature Granulocytes 0.02 0.00 - 0.07 K/uL  Comprehensive metabolic panel     Status: Abnormal   Collection Time: 09/07/22 10:47 AM  Result Value Ref Range   Sodium 133 (L) 135 - 145 mmol/L   Potassium 3.9 3.5 - 5.1 mmol/L   Chloride 102 98 - 111 mmol/L   CO2 25 22 - 32 mmol/L   Glucose, Bld 242 (H) 70 - 99 mg/dL   BUN 15 6 - 20 mg/dL   Creatinine, Ser 5.570.69 0.44 - 1.00 mg/dL   Calcium 9.1 8.9 - 32.210.3 mg/dL   Total Protein 8.0 6.5 - 8.1 g/dL   Albumin 4.5 3.5 - 5.0 g/dL   AST 20 15 - 41 U/L   ALT 13 0 - 44 U/L   Alkaline Phosphatase 61 38 - 126 U/L   Total Bilirubin 0.3 0.3 - 1.2 mg/dL   GFR, Estimated >02>60 >54>60 mL/min   Anion gap 6 5 - 15  Urinalysis, Microscopic (reflex)     Status: Abnormal   Collection Time: 09/07/22 10:47 AM  Result Value Ref Range   RBC / HPF 6-10 0 - 5 RBC/hpf  WBC, UA NONE SEEN 0 - 5 WBC/hpf   Bacteria, UA RARE (A) NONE SEEN   Squamous Epithelial / LPF 0-5 0 - 5  Resp Panel by RT-PCR (Flu A&B, Covid) Anterior Nasal Swab     Status: Abnormal   Collection Time: 09/07/22 11:49 AM   Specimen: Anterior Nasal Swab  Result Value Ref Range   SARS Coronavirus 2 by RT PCR POSITIVE (A) NEGATIVE   Influenza A by PCR NEGATIVE NEGATIVE   Influenza B by PCR NEGATIVE NEGATIVE   DG Chest 2 View  Result Date: 09/07/2022 CLINICAL DATA:  Productive cough for 2 weeks EXAM: CHEST - 2 VIEW COMPARISON:  09/03/2019 FINDINGS: Similar thoracic dextroscoliosis. Normal heart size and vascularity. Negative for pneumonia, collapse or consolidation. No edema pattern, effusion or pneumothorax.  Trachea midline. No osseous finding. IMPRESSION: Stable exam. No interval change or acute finding by plain radiography Electronically Signed   By: Judie Petit.  Shick M.D.   On: 09/07/2022 11:04    ED Clinical Impression  1. COVID-19 virus infection   2. Nausea vomiting and diarrhea   3. Acute non-recurrent pansinusitis      ED Assessment/Plan     Phone number [phone].  Previous urgent care records reviewed.  As noted in HPI.  Patient is a type I diabetic and asthmatic with nausea, multiple episodes of nonbilious, nonbloody urine, diarrhea without any abdominal pain or distention starting this morning.  She also reports a productive cough with sinus pain and pressure that has been going on for 2 weeks.  Checking COVID/flu UA, urine pregnancy, lipase, CBC, CMP, chest x-ray.  Will give Zofran 4 mg ODT here.  Her vitals are normal, cap refill is less than 2 seconds.  I do not think that she needs IV fluids at this point in time.  Her abdomen is benign.  There is no evidence of a surgical abdomen.  Imaging independently reviewed.  No pneumonia, consolidation.  See radiology report for details.  Labs reviewed.  Urine pregnancy negative.  Glucose 242, but she has no no anion gap, she is not acidotic with a normal CO2.  She has ketones in her urine, but I suspect this is from some dehydration.  Will send home with Zofran, and have her try oral rehydration at home with electrolyte containing fluids.  She is to go to the ED if her glucose gets above 250, she continues to have nausea, vomiting, and diarrhea, polyuria/polydipsia, unintentional weight loss, or for any concerns.  Her x-ray is negative for pneumonia.  She has maxillary and frontal sinus tenderness.  Suspect that the productive cough is from postnasal drip.  Given duration of symptoms, will send home with doxycycline to treat a sinusitis which will also cover any possible pneumonia not visualized on x-ray today. Will also prescribe a spacer  for her albuterol inhaler.  COVID-positive.  GFR above 60. After discussing risks and benefits of Paxlovid versus molnupiravir with patient, will prescribe Paxlovid to pharmacy on record.  Continue doxycycline for now as she has been sick for 2 weeks.  Discussed labs, imaging, MDM, treatment plan, and plan for follow-up with patient. Discussed sn/sx that should prompt return to the ED. patient agrees with plan.   Meds ordered this encounter  Medications   ondansetron (ZOFRAN-ODT) disintegrating tablet 4 mg   doxycycline (VIBRAMYCIN) 100 MG capsule    Sig: Take 1 capsule (100 mg total) by mouth 2 (two) times daily for 10 days.    Dispense:  20 capsule  Refill:  0   ondansetron (ZOFRAN-ODT) 4 MG disintegrating tablet    Sig: Take 1 tablet (4 mg total) by mouth every 8 (eight) hours as needed for nausea or vomiting.    Dispense:  20 tablet    Refill:  0   Spacer/Aero-Holding Chambers (AEROCHAMBER MV) inhaler    Sig: Use as instructed    Dispense:  1 each    Refill:  1      *This clinic note was created using Dragon dictation software. Therefore, there may be occasional mistakes despite careful proofreading.  ?    Melynda Ripple, MD 09/07/22 1302

## 2022-09-27 ENCOUNTER — Ambulatory Visit
Admission: RE | Admit: 2022-09-27 | Discharge: 2022-09-27 | Disposition: A | Discharge status: Home / Self Care | Payer: Medicaid Other | Source: Ambulatory Visit | Attending: Family Medicine | Admitting: Family Medicine

## 2022-09-27 VITALS — BP 138/83 | HR 79 | Temp 98.4°F | Resp 16 | Ht 62.0 in | Wt 130.1 lb

## 2022-09-27 DIAGNOSIS — H66002 Acute suppurative otitis media without spontaneous rupture of ear drum, left ear: Secondary | ICD-10-CM

## 2022-09-27 MED ORDER — AMOXICILLIN 500 MG PO TABS: 500.0000 mg | ORAL_TABLET | Freq: Two times a day (BID) | ORAL | 0 refills | Status: AC

## 2022-09-27 MED ORDER — ONDANSETRON 4 MG PO TBDP: 4.0000 mg | ORAL_TABLET | Freq: Three times a day (TID) | ORAL | 0 refills | Status: AC | PRN

## 2022-09-27 NOTE — Discharge Instructions (Addendum)

## 2022-09-27 NOTE — ED Provider Notes (Signed)
[facility]    CSN: [id number] Arrival date & time: 09/27/22  1022      History   Chief Complaint Chief Complaint  Patient presents with   Otalgia    left    HPI Natalie Marshall is a 26 y.o. female.   HPI   Natalie Marshall presents for left ear pain. Denies fever, nasal congestion, rhinorrhea. States Saturday around 10 PM she had diarrhea and vomiting. Symtpms lasted until Sunday. She has gotten espoghaus tear in the past from vomiting.  Sunday afternoon her left ear started hurting. Took Advil and Daytime Cold and Flu without significant relief. She had COVID a couple weeks ago. Continues to have a cough but reports it is improving.      Past Medical History:  Diagnosis Date   Diabetes mellitus type 1, uncomplicated, on long term insulin pump (HCC)    Diabetes mellitus without complication (HCC)     There are no problems to display for this patient.   Past Surgical History:  Procedure Laterality Date   TONSILLECTOMY      OB History   No obstetric history on file.      Home Medications    Prior to Admission medications   Medication Sig Start Date End Date Taking? Authorizing Provider  albuterol (VENTOLIN HFA) 108 (90 Base) MCG/ACT inhaler Inhale into the lungs. 10/29/17  Yes [provider]  amoxicillin (AMOXIL) 500 MG tablet Take 1 tablet (500 mg total) by mouth 2 (two) times daily for 10 days. 09/27/22 10/07/22 Yes Antario Yasuda, Seward Meth, DO  Continuous Blood Gluc Receiver (DEXCOM G6 RECEIVER) DEVI  04/09/19  Yes [provider]  insulin aspart (NOVOLOG) 100 UNIT/ML injection USE WITH INSULIN PUMP AS DIRECTED. 06/09/19  Yes [provider]  Levonorgestrel (KYLEENA) 19.5 MG IUD by Intrauterine route. 02/22/17  Yes [provider]  benzonatate (TESSALON) 100 MG capsule Take 2 capsules (200 mg total) by mouth every 8 (eight) hours. 08/30/22   Becky Augusta, NP  insulin aspart (NOVOLOG FLEXPEN) 100 UNIT/ML FlexPen Inject into the  skin. 07/03/19 09/07/22  [provider]  ipratropium (ATROVENT) 0.06 % nasal spray Place 2 sprays into both nostrils 4 (four) times daily. 08/30/22   Becky Augusta, NP  ondansetron (ZOFRAN-ODT) 4 MG disintegrating tablet Take 1 tablet (4 mg total) by mouth every 8 (eight) hours as needed for nausea or vomiting. 09/27/22   Katha Cabal, DO  promethazine-dextromethorphan (PROMETHAZINE-DM) 6.25-15 MG/5ML syrup Take 5 mLs by mouth 4 (four) times daily as needed. 08/30/22   Becky Augusta, NP  Spacer/Aero-Holding Deretha Emory (AEROCHAMBER MV) inhaler Use as instructed 09/07/22   Domenick Gong, MD    Family History Family History  Problem Relation Age of Onset   Cancer Mother    Healthy Father     Social History Social History   Tobacco Use   Smoking status: Never   Smokeless tobacco: Never  Vaping Use   Vaping Use: Some days  Substance Use Topics   Alcohol use: Not Currently   Drug use: Yes    Types: Marijuana     Allergies   Marine algaes [algae extract], Pecan extract allergy skin test, Capsaicin, and Amoxicillin-pot clavulanate   Review of Systems Review of Systems: negative unless otherwise stated in HPI.      Physical Exam Triage Vital Signs ED Triage Vitals  Enc Vitals Group     BP 09/27/22 1103 138/83     Pulse Rate 09/27/22 1103 79     Resp 09/27/22 1103 16  Temp 09/27/22 1103 98.4 F (36.9 C)     Temp Source 09/27/22 1103 Oral     SpO2 09/27/22 1103 100 %     Weight 09/27/22 1102 130 lb 1.1 oz (59 kg)     Height 09/27/22 1102 5\' 2"  (1.575 m)     Head Circumference --      Peak Flow --      Pain Score 09/27/22 1101 7     Pain Loc --      Pain Edu? --      Excl. in GC? --    No data found.  Updated Vital Signs BP 138/83 (BP Location: Right Arm)   Pulse 79   Temp 98.4 F (36.9 C) (Oral)   Resp 16   Ht 5\' 2"  (1.575 m)   Wt 59 kg   LMP 08/29/2022 (Exact Date)   SpO2 100%   BMI 23.79 kg/m   Visual Acuity Right Eye Distance:   Left Eye  Distance:   Bilateral Distance:    Right Eye Near:   Left Eye Near:    Bilateral Near:     Physical Exam GEN:     alert, non-ill appearing female in no distress     HENT:  mucus membranes moist, oropharyngeal without lesions or exudate, no tonsillar hypertrophy,  mild oropharyngeal erythema, right TM opaque, left TM erythematous and opaque, erythematous auditory canal, non-tender tragus  EYES:   pupils equal and reactive, EOMi ,  no scleral injection NECK:  normal ROM, left anterior lymphadenopathy,  no meningismus   RESP:  no increased work of breathing,  clear to auscultation bilaterally CVS:   regular rate  and rhythm Skin:   warm and dry, no rash on visible skin , normal  skin turgor    UC Treatments / Results  Labs (all labs ordered are listed, but only abnormal results are displayed) Labs Reviewed - No data to display  EKG   Radiology No results found.  Procedures Procedures (including critical care time)  Medications Ordered in UC Medications - No data to display  Initial Impression / Assessment and Plan / UC Course  I have reviewed the triage vital signs and the nursing notes.  Pertinent labs & imaging results that were available during my care of the patient were reviewed by me and considered in my medical decision making (see chart for details).       Pt is a 26 y.o. female with history of T1DM who presents for 5 days of left ear pain. Trana is  afebrile here without recent antipyretics. Satting well on room air. Overall pt is  well appearing, well hydrated, without respiratory distress. Pulmonary exam  is unremarkable.  She was COVID positive on 09/07/22. Has ongoing cough but this is improving. Has evidence left acute otitis media, on exam. Treat with amoxicillin for 10 days.  Tylenol/Motrin's as needed for fever or discomfort.  Stressed importance of hydration. Zofran for nausea.   Discussed MDM, treatment plan and plan for follow-up with patient/parent  who agrees with plan.    Final Clinical Impressions(s) / UC Diagnoses   Final diagnoses:  Non-recurrent acute suppurative otitis media of left ear without spontaneous rupture of tympanic membrane     Discharge Instructions      Stop by the pharmacy to pick up your prescriptions.  Follow up with your primary care provider as needed.  Go to ED for red flag symptoms, including; fevers you cannot reduce with Tylenol/Motrin, severe headaches, vision  changes, numbness/weakness in part of the body, lethargy, confusion, intractable vomiting, severe dehydration, chest pain, breathing difficulty, severe persistent abdominal or pelvic pain, signs of severe infection (increased redness, swelling of an area), feeling faint or passing out, dizziness, etc. You should especially go to the ED for sudden acute worsening of condition if you do not elect to go at this time.       ED Prescriptions     Medication Sig Dispense Auth. Provider   amoxicillin (AMOXIL) 500 MG tablet Take 1 tablet (500 mg total) by mouth 2 (two) times daily for 10 days. 20 tablet Takeila Thayne, DO   ondansetron (ZOFRAN-ODT) 4 MG disintegrating tablet Take 1 tablet (4 mg total) by mouth every 8 (eight) hours as needed for nausea or vomiting. 20 tablet Lyndee Hensen, DO      PDMP not reviewed this encounter.   Lyndee Hensen, DO 09/27/22 1135

## 2022-09-27 NOTE — ED Triage Notes (Signed)
Pt c/o left ear pain. She states over the weekend she had a lot of vomiting that has since resolved but she has a sore throat from vomiting. She states she has covid, and pneumonia about 3 weeks ago and still has a cough.
